# Patient Record
Sex: Male | Born: 1945 | Race: Black or African American | Hispanic: No | Smoking: Former smoker
Health system: Southern US, Community
[De-identification: ages and names within clinical notes are randomized; demographics above are authoritative.]

## PROBLEM LIST (undated history)

## (undated) DIAGNOSIS — E7800 Pure hypercholesterolemia, unspecified: Secondary | ICD-10-CM

## (undated) DIAGNOSIS — E079 Disorder of thyroid, unspecified: Secondary | ICD-10-CM

## (undated) DIAGNOSIS — G43909 Migraine, unspecified, not intractable, without status migrainosus: Secondary | ICD-10-CM

## (undated) DIAGNOSIS — I10 Essential (primary) hypertension: Secondary | ICD-10-CM

## (undated) DIAGNOSIS — G4730 Sleep apnea, unspecified: Secondary | ICD-10-CM

## (undated) HISTORY — PX: BACK SURGERY: SHX140

## (undated) HISTORY — PX: THYROID SURGERY: SHX805

---

## 2011-09-08 ENCOUNTER — Encounter (HOSPITAL_BASED_OUTPATIENT_CLINIC_OR_DEPARTMENT_OTHER): Payer: Self-pay | Admitting: *Deleted

## 2011-09-08 ENCOUNTER — Inpatient Hospital Stay (HOSPITAL_BASED_OUTPATIENT_CLINIC_OR_DEPARTMENT_OTHER)
Admission: EM | Admit: 2011-09-08 | Discharge: 2011-09-13 | DRG: 190 | Disposition: A | Discharge status: Home / Self Care | Attending: Internal Medicine | Admitting: Internal Medicine

## 2011-09-08 ENCOUNTER — Emergency Department (HOSPITAL_BASED_OUTPATIENT_CLINIC_OR_DEPARTMENT_OTHER)

## 2011-09-08 DIAGNOSIS — K219 Gastro-esophageal reflux disease without esophagitis: Secondary | ICD-10-CM | POA: Diagnosis present

## 2011-09-08 DIAGNOSIS — E669 Obesity, unspecified: Secondary | ICD-10-CM | POA: Diagnosis present

## 2011-09-08 DIAGNOSIS — Z9989 Dependence on other enabling machines and devices: Secondary | ICD-10-CM

## 2011-09-08 DIAGNOSIS — E872 Acidosis, unspecified: Secondary | ICD-10-CM | POA: Diagnosis present

## 2011-09-08 DIAGNOSIS — R739 Hyperglycemia, unspecified: Secondary | ICD-10-CM

## 2011-09-08 DIAGNOSIS — J441 Chronic obstructive pulmonary disease with (acute) exacerbation: Principal | ICD-10-CM

## 2011-09-08 DIAGNOSIS — E785 Hyperlipidemia, unspecified: Secondary | ICD-10-CM | POA: Diagnosis present

## 2011-09-08 DIAGNOSIS — J189 Pneumonia, unspecified organism: Secondary | ICD-10-CM | POA: Diagnosis present

## 2011-09-08 DIAGNOSIS — Z6832 Body mass index (BMI) 32.0-32.9, adult: Secondary | ICD-10-CM

## 2011-09-08 DIAGNOSIS — Z9119 Patient's noncompliance with other medical treatment and regimen: Secondary | ICD-10-CM

## 2011-09-08 DIAGNOSIS — J9602 Acute respiratory failure with hypercapnia: Secondary | ICD-10-CM

## 2011-09-08 DIAGNOSIS — G4733 Obstructive sleep apnea (adult) (pediatric): Secondary | ICD-10-CM | POA: Diagnosis present

## 2011-09-08 DIAGNOSIS — Z23 Encounter for immunization: Secondary | ICD-10-CM

## 2011-09-08 DIAGNOSIS — J9620 Acute and chronic respiratory failure, unspecified whether with hypoxia or hypercapnia: Secondary | ICD-10-CM | POA: Diagnosis present

## 2011-09-08 DIAGNOSIS — E119 Type 2 diabetes mellitus without complications: Secondary | ICD-10-CM | POA: Diagnosis present

## 2011-09-08 DIAGNOSIS — K5900 Constipation, unspecified: Secondary | ICD-10-CM

## 2011-09-08 DIAGNOSIS — E039 Hypothyroidism, unspecified: Secondary | ICD-10-CM | POA: Diagnosis present

## 2011-09-08 DIAGNOSIS — Z87891 Personal history of nicotine dependence: Secondary | ICD-10-CM

## 2011-09-08 DIAGNOSIS — I10 Essential (primary) hypertension: Secondary | ICD-10-CM | POA: Diagnosis present

## 2011-09-08 DIAGNOSIS — E662 Morbid (severe) obesity with alveolar hypoventilation: Secondary | ICD-10-CM | POA: Diagnosis present

## 2011-09-08 DIAGNOSIS — Z91199 Patient's noncompliance with other medical treatment and regimen due to unspecified reason: Secondary | ICD-10-CM

## 2011-09-08 DIAGNOSIS — Z79899 Other long term (current) drug therapy: Secondary | ICD-10-CM

## 2011-09-08 HISTORY — DX: Disorder of thyroid, unspecified: E07.9

## 2011-09-08 HISTORY — DX: Pure hypercholesterolemia, unspecified: E78.00

## 2011-09-08 HISTORY — DX: Essential (primary) hypertension: I10

## 2011-09-08 HISTORY — DX: Migraine, unspecified, not intractable, without status migrainosus: G43.909

## 2011-09-08 HISTORY — DX: Sleep apnea, unspecified: G47.30

## 2011-09-08 LAB — COMPREHENSIVE METABOLIC PANEL
ALT: 23 U/L (ref 0–53)
AST: 21 U/L (ref 0–37)
Albumin: 4.1 g/dL (ref 3.5–5.2)
Alkaline Phosphatase: 49 U/L (ref 39–117)
CO2: 39 mEq/L — ABNORMAL HIGH (ref 19–32)
Chloride: 101 mEq/L (ref 96–112)
Creatinine, Ser: 1.3 mg/dL (ref 0.50–1.35)
GFR calc non Af Amer: 57 mL/min — ABNORMAL LOW (ref >90)
Potassium: 4.5 mEq/L (ref 3.5–5.1)
Sodium: 143 mEq/L (ref 135–145)
Total Bilirubin: 0.3 mg/dL (ref 0.3–1.2)

## 2011-09-08 LAB — POCT I-STAT 3, ART BLOOD GAS (G3+)
Acid-Base Excess: 9 mmol/L — ABNORMAL HIGH (ref 0.0–2.0)
Bicarbonate: 39.3 mEq/L — ABNORMAL HIGH (ref 20.0–24.0)
O2 Saturation: 96 %
Patient temperature: 98.6
TCO2: 42 mmol/L (ref 0–100)
pO2, Arterial: 100 mmHg (ref 80.0–100.0)

## 2011-09-08 LAB — CBC WITH DIFFERENTIAL/PLATELET
Basophils Absolute: 0 10*3/uL (ref 0.0–0.1)
HCT: 42.2 % (ref 39.0–52.0)
Lymphocytes Relative: 34 % (ref 12–46)
Monocytes Absolute: 0.6 10*3/uL (ref 0.1–1.0)
Neutro Abs: 2.9 10*3/uL (ref 1.7–7.7)
Neutrophils Relative %: 53 % (ref 43–77)
RDW: 14.8 % (ref 11.5–15.5)
WBC: 5.5 10*3/uL (ref 4.0–10.5)

## 2011-09-08 MED ORDER — ALBUTEROL SULFATE (5 MG/ML) 0.5% IN NEBU
5.0000 mg | INHALATION_SOLUTION | Freq: Once | RESPIRATORY_TRACT | Status: AC
  Administered 2011-09-08: 5 mg via RESPIRATORY_TRACT
  Filled 2011-09-08: qty 1

## 2011-09-08 MED ORDER — IPRATROPIUM BROMIDE 0.02 % IN SOLN
0.5000 mg | Freq: Once | RESPIRATORY_TRACT | Status: AC
  Administered 2011-09-08: 0.5 mg via RESPIRATORY_TRACT
  Filled 2011-09-08: qty 2.5

## 2011-09-08 MED ORDER — METHYLPREDNISOLONE SODIUM SUCC 125 MG IJ SOLR
125.0000 mg | Freq: Once | INTRAMUSCULAR | Status: AC
  Administered 2011-09-08: 125 mg via INTRAVENOUS
  Filled 2011-09-08: qty 2

## 2011-09-08 NOTE — ED Provider Notes (Addendum)
History     CSN: [id number]  Arrival date & time 09/08/11  1958   First MD Initiated Contact with Patient 09/08/11 2019      Chief Complaint  Patient presents with  . Shortness of Breath    (Consider location/radiation/quality/duration/timing/severity/associated sxs/prior treatment) Patient is a 66 y.o. male presenting with shortness of breath. The history is provided by the patient.  Shortness of Breath  Episode onset: 3 weeks. The onset was sudden. The problem occurs continuously. The problem has been gradually worsening. The problem is moderate. Nothing relieves the symptoms. Exacerbated by: bending over. Associated symptoms include shortness of breath. There was no intake of a foreign body. He was not exposed to toxic fumes. He has not inhaled smoke recently. He has had no prior steroid use.  Pt reports he normally uses a c pap machine and did not bring with him to [facility].  Pt is visiting here from [place].  Pt is planning to move here in 3 months.  Pt reports his last visit with his Md he was told lungs were okay.  Past Medical History  Diagnosis Date  . Sleep apnea   . Hypertension   . Thyroid disease   . Hypercholesteremia   . Migraines     Past Surgical History  Procedure Date  . Back surgery   . Thyroid surgery     History reviewed. No pertinent family history.  History  Substance Use Topics  . Smoking status: Never Smoker   . Smokeless tobacco: Not on file  . Alcohol Use: Yes      Review of Systems  Respiratory: Positive for shortness of breath.   All other systems reviewed and are negative.    Allergies  Review of patient's allergies indicates not on file.  Home Medications  No current outpatient prescriptions on file.  BP 154/97  Pulse 60  Temp 97.9 F (36.6 C) (Oral)  Resp 28  Ht 6' (1.829 m)  Wt 232 lb (105.235 kg)  BMI 31.47 kg/m2  SpO2 97%  Physical Exam  Nursing note and vitals reviewed. Constitutional: He is oriented to person,  place, and time. He appears well-developed and well-nourished.  HENT:  Head: Normocephalic and atraumatic.  Right Ear: External ear normal.  Left Ear: External ear normal.  Nose: Nose normal.  Eyes: Conjunctivae and EOM are normal. Pupils are equal, round, and reactive to light.  Neck: Normal range of motion. Neck supple.  Cardiovascular: Normal rate, regular rhythm and normal heart sounds.   Pulmonary/Chest: He has no wheezes. He exhibits no tenderness.  Abdominal: Soft.  Musculoskeletal: Normal range of motion.  Neurological: He is alert and oriented to person, place, and time.  Skin: Skin is warm.  Psychiatric: He has a normal mood and affect.    ED Course  Procedures (including critical care time)  Labs Reviewed - No data to display No results found.   1. COPD with acute exacerbation     Pt given solumedrol, atrovent and albuterol,    MDM   Date: 09/08/2011  Rate: 61  Rhythm: normal sinus rhythm  QRS Axis: normal  Intervals: normal  ST/T Wave abnormalities: normal  Conduction Disutrbances:none  Narrative Interpretation:   Old EKG Reviewed: none available    Results for orders placed during the hospital encounter of 09/08/11  CBC WITH DIFFERENTIAL      Component Value Range   WBC 5.5  4.0 - 10.5 K/uL   RBC 4.65  4.22 - 5.81 MIL/uL   Hemoglobin  13.0  13.0 - 17.0 g/dL   HCT 16.1  09.6 - 04.5 %   MCV 90.8  78.0 - 100.0 fL   MCH 28.0  26.0 - 34.0 pg   MCHC 30.8  30.0 - 36.0 g/dL   RDW 40.9  81.1 - 91.4 %   Platelets 178  150 - 400 K/uL   Neutrophils Relative 53  43 - 77 %   Neutro Abs 2.9  1.7 - 7.7 K/uL   Lymphocytes Relative 34  12 - 46 %   Lymphs Abs 1.9  0.7 - 4.0 K/uL   Monocytes Relative 11  3 - 12 %   Monocytes Absolute 0.6  0.1 - 1.0 K/uL   Eosinophils Relative 2  0 - 5 %   Eosinophils Absolute 0.1  0.0 - 0.7 K/uL   Basophils Relative 0  0 - 1 %   Basophils Absolute 0.0  0.0 - 0.1 K/uL  COMPREHENSIVE METABOLIC PANEL      Component Value Range    Sodium 143  135 - 145 mEq/L   Potassium 4.5  3.5 - 5.1 mEq/L   Chloride 101  96 - 112 mEq/L   CO2 39 (*) 19 - 32 mEq/L   Glucose, Bld 121 (*) 70 - 99 mg/dL   BUN 19  6 - 23 mg/dL   Creatinine, Ser 7.82  0.50 - 1.35 mg/dL   Calcium 9.1  8.4 - 95.6 mg/dL   Total Protein 7.2  6.0 - 8.3 g/dL   Albumin 4.1  3.5 - 5.2 g/dL   AST 21  0 - 37 U/L   ALT 23  0 - 53 U/L   Alkaline Phosphatase 49  39 - 117 U/L   Total Bilirubin 0.3  0.3 - 1.2 mg/dL   GFR calc non Af Amer 57 (*) >90 mL/min   GFR calc Af Amer 66 (*) >90 mL/min  TROPONIN I      Component Value Range   Troponin I <0.30  <0.30 ng/mL  D-DIMER, QUANTITATIVE      Component Value Range   D-Dimer, Quant <0.22  0.00 - 0.48 ug/mL-FEU  POCT I-STAT 3, BLOOD GAS (G3+)      Component Value Range   pH, Arterial 7.270 (*) 7.350 - 7.450   pCO2 arterial 85.5 (*) 35.0 - 45.0 mmHg   pO2, Arterial 100.0  80.0 - 100.0 mmHg   Bicarbonate 39.3 (*) 20.0 - 24.0 mEq/L   TCO2 42  0 - 100 mmol/L   O2 Saturation 96.0     Acid-Base Excess 9.0 (*) 0.0 - 2.0 mmol/L   Patient temperature 98.6 F     Collection site RADIAL, ALLEN'S TEST ACCEPTABLE     Drawn by RT     Sample type ARTERIAL     Comment NOTIFIED PHYSICIAN     Dg Chest 2 View  09/08/2011  *RADIOLOGY REPORT*  Clinical Data: Shortness of breath with difficulty breathing.  CHEST - 2 VIEW  Comparison: None.  Findings: Patient is status post Harrington rod thoracolumbar rod fixation.  There is a moderate convex right scoliosis.  There are old right-sided rib fractures with chest wall deformity.  The heart size and mediastinal contours appear normal allowing for the scoliosis.  The lungs are clear.  There is no pleural effusion or pneumothorax.  IMPRESSION: Scoliosis and old right-sided rib fractures.  No acute cardiopulmonary process.   Original Report Authenticated By: Gerrianne Scale, M.D.    I spoke with Dr. Haroldine Laws with [facility]  who will admit at [facility] [place], [place] 09/08/11  2242  Elson Areas, PA 09/08/11 2245  Lonia Skinner [place], [place] 09/08/11 [phone]

## 2011-09-08 NOTE — ED Notes (Signed)
Pt states he has a hx of sleep apnea and has been increasingly SHOB for 3 weeks. O2 sat 86% initially at triage. 94% after taking deep breaths, but returned to the 80's. Taken to [employer]. EKG done and respiratory to bedside. BBS-diminished. Also states he has felt faint when bending over. Stomach distended.

## 2011-09-08 NOTE — ED Provider Notes (Signed)
History/physical exam/procedure(s) were performed by non-physician practitioner and as supervising physician I was immediately available for consultation/collaboration. I have reviewed all notes and am in agreement with care and plan.   Levaughn Puccinelli S Aerilyn Slee, MD 09/08/11 2304 

## 2011-09-09 ENCOUNTER — Inpatient Hospital Stay (HOSPITAL_COMMUNITY)

## 2011-09-09 ENCOUNTER — Encounter (HOSPITAL_COMMUNITY): Payer: Self-pay | Admitting: *Deleted

## 2011-09-09 DIAGNOSIS — J441 Chronic obstructive pulmonary disease with (acute) exacerbation: Principal | ICD-10-CM

## 2011-09-09 DIAGNOSIS — E785 Hyperlipidemia, unspecified: Secondary | ICD-10-CM

## 2011-09-09 DIAGNOSIS — J9600 Acute respiratory failure, unspecified whether with hypoxia or hypercapnia: Secondary | ICD-10-CM

## 2011-09-09 DIAGNOSIS — K5900 Constipation, unspecified: Secondary | ICD-10-CM

## 2011-09-09 DIAGNOSIS — Z9989 Dependence on other enabling machines and devices: Secondary | ICD-10-CM | POA: Diagnosis present

## 2011-09-09 DIAGNOSIS — I10 Essential (primary) hypertension: Secondary | ICD-10-CM | POA: Diagnosis present

## 2011-09-09 DIAGNOSIS — R739 Hyperglycemia, unspecified: Secondary | ICD-10-CM

## 2011-09-09 DIAGNOSIS — E039 Hypothyroidism, unspecified: Secondary | ICD-10-CM

## 2011-09-09 DIAGNOSIS — J9602 Acute respiratory failure with hypercapnia: Secondary | ICD-10-CM

## 2011-09-09 LAB — BLOOD GAS, ARTERIAL
Acid-Base Excess: 8.5 mmol/L — ABNORMAL HIGH (ref 0.0–2.0)
Acid-Base Excess: 8.8 mmol/L — ABNORMAL HIGH (ref 0.0–2.0)
Bicarbonate: 35.7 mEq/L — ABNORMAL HIGH (ref 20.0–24.0)
Bicarbonate: 34.8 mEq/L — ABNORMAL HIGH (ref 20.0–24.0)
Delivery systems: POSITIVE
Drawn by: 249101
Drawn by: 20517
Mode: POSITIVE
O2 Content: 3 L/min
O2 Saturation: 92.6 %
Patient temperature: 98.6
TCO2: 38 mmol/L (ref 0–100)
pCO2 arterial: 79.2 mmHg (ref 35.0–45.0)
pCO2 arterial: 66.3 mmHg (ref 35.0–45.0)
pH, Arterial: 7.274 — ABNORMAL LOW (ref 7.350–7.450)
pO2, Arterial: 77.4 mmHg — ABNORMAL LOW (ref 80.0–100.0)
pO2, Arterial: 64.2 mmHg — ABNORMAL LOW (ref 80.0–100.0)

## 2011-09-09 LAB — COMPREHENSIVE METABOLIC PANEL
ALT: 23 U/L (ref 0–53)
AST: 18 U/L (ref 0–37)
Alkaline Phosphatase: 51 U/L (ref 39–117)
CO2: 34 mEq/L — ABNORMAL HIGH (ref 19–32)
GFR calc Af Amer: 78 mL/min — ABNORMAL LOW (ref >90)
GFR calc non Af Amer: 67 mL/min — ABNORMAL LOW (ref >90)
Glucose, Bld: 300 mg/dL — ABNORMAL HIGH (ref 70–99)
Potassium: 4.8 mEq/L (ref 3.5–5.1)
Sodium: 141 mEq/L (ref 135–145)

## 2011-09-09 LAB — CBC WITH DIFFERENTIAL/PLATELET
Basophils Absolute: 0 10*3/uL (ref 0.0–0.1)
Basophils Relative: 0 % (ref 0–1)
Eosinophils Absolute: 0 10*3/uL (ref 0.0–0.7)
HCT: 43.5 % (ref 39.0–52.0)
MCH: 28.2 pg (ref 26.0–34.0)
MCHC: 30.3 g/dL (ref 30.0–36.0)
Monocytes Absolute: 0 10*3/uL — ABNORMAL LOW (ref 0.1–1.0)
Neutro Abs: 5.8 10*3/uL (ref 1.7–7.7)
Neutrophils Relative %: 90 % — ABNORMAL HIGH (ref 43–77)
RDW: 14.7 % (ref 11.5–15.5)

## 2011-09-09 LAB — STREP PNEUMONIAE URINARY ANTIGEN: Strep Pneumo Urinary Antigen: NEGATIVE

## 2011-09-09 LAB — GLUCOSE, CAPILLARY
Glucose-Capillary: 207 mg/dL — ABNORMAL HIGH (ref 70–99)
Glucose-Capillary: 229 mg/dL — ABNORMAL HIGH (ref 70–99)

## 2011-09-09 LAB — HEMOGLOBIN A1C: Hgb A1c MFr Bld: 7.4 % — ABNORMAL HIGH (ref <5.7)

## 2011-09-09 LAB — MRSA PCR SCREENING: MRSA by PCR: NEGATIVE

## 2011-09-09 LAB — PRO B NATRIURETIC PEPTIDE: Pro B Natriuretic peptide (BNP): 63.8 pg/mL (ref 0–125)

## 2011-09-09 MED ORDER — SODIUM CHLORIDE 0.9 % IJ SOLN
3.0000 mL | Freq: Two times a day (BID) | INTRAMUSCULAR | Status: DC
  Administered 2011-09-09 – 2011-09-11 (×5): 3 mL via INTRAVENOUS

## 2011-09-09 MED ORDER — MOXIFLOXACIN HCL IN NACL 400 MG/250ML IV SOLN
400.0000 mg | INTRAVENOUS | Status: DC
  Administered 2011-09-09 – 2011-09-11 (×3): 400 mg via INTRAVENOUS
  Filled 2011-09-09 (×3): qty 250

## 2011-09-09 MED ORDER — METHYLPREDNISOLONE SODIUM SUCC 125 MG IJ SOLR
80.0000 mg | Freq: Two times a day (BID) | INTRAMUSCULAR | Status: DC
  Administered 2011-09-10: 80 mg via INTRAVENOUS
  Filled 2011-09-09: qty 1.28
  Filled 2011-09-09: qty 2
  Filled 2011-09-09: qty 1.28

## 2011-09-09 MED ORDER — ALBUTEROL SULFATE (5 MG/ML) 0.5% IN NEBU
2.5000 mg | INHALATION_SOLUTION | Freq: Four times a day (QID) | RESPIRATORY_TRACT | Status: DC
  Administered 2011-09-09 – 2011-09-13 (×17): 2.5 mg via RESPIRATORY_TRACT
  Filled 2011-09-09 (×17): qty 0.5

## 2011-09-09 MED ORDER — LEVOTHYROXINE SODIUM 125 MCG PO TABS
125.0000 ug | ORAL_TABLET | Freq: Every day | ORAL | Status: DC
  Administered 2011-09-10 – 2011-09-13 (×4): 125 ug via ORAL
  Filled 2011-09-09 (×5): qty 1

## 2011-09-09 MED ORDER — PROPRANOLOL HCL ER BEADS 120 MG PO CP24
120.0000 mg | ORAL_CAPSULE | Freq: Every day | ORAL | Status: DC
  Filled 2011-09-09: qty 1

## 2011-09-09 MED ORDER — HYDROMORPHONE HCL PF 1 MG/ML IJ SOLN
0.5000 mg | Freq: Once | INTRAMUSCULAR | Status: AC
  Administered 2011-09-09: 0.5 mg via INTRAVENOUS
  Filled 2011-09-09: qty 1

## 2011-09-09 MED ORDER — ACETAMINOPHEN 325 MG PO TABS
650.0000 mg | ORAL_TABLET | Freq: Four times a day (QID) | ORAL | Status: DC | PRN
  Administered 2011-09-10: 650 mg via ORAL
  Filled 2011-09-09: qty 2

## 2011-09-09 MED ORDER — LISINOPRIL 2.5 MG PO TABS
2.5000 mg | ORAL_TABLET | Freq: Every day | ORAL | Status: DC
  Filled 2011-09-09: qty 1

## 2011-09-09 MED ORDER — ASPIRIN 325 MG PO TABS
325.0000 mg | ORAL_TABLET | Freq: Every day | ORAL | Status: DC
  Administered 2011-09-10 – 2011-09-13 (×4): 325 mg via ORAL
  Filled 2011-09-09 (×5): qty 1

## 2011-09-09 MED ORDER — INSULIN GLARGINE 100 UNIT/ML ~~LOC~~ SOLN
5.0000 [IU] | Freq: Every day | SUBCUTANEOUS | Status: DC
  Administered 2011-09-09: 5 [IU] via SUBCUTANEOUS

## 2011-09-09 MED ORDER — PANTOPRAZOLE SODIUM 40 MG PO TBEC
40.0000 mg | DELAYED_RELEASE_TABLET | Freq: Every day | ORAL | Status: DC
  Administered 2011-09-10 – 2011-09-13 (×4): 40 mg via ORAL
  Filled 2011-09-09 (×4): qty 1

## 2011-09-09 MED ORDER — HYDRALAZINE HCL 20 MG/ML IJ SOLN
10.0000 mg | INTRAMUSCULAR | Status: DC | PRN
  Administered 2011-09-11: 10 mg via INTRAVENOUS
  Filled 2011-09-09 (×2): qty 0.5

## 2011-09-09 MED ORDER — ENOXAPARIN SODIUM 40 MG/0.4ML ~~LOC~~ SOLN
40.0000 mg | SUBCUTANEOUS | Status: DC
  Administered 2011-09-09 – 2011-09-11 (×3): 40 mg via SUBCUTANEOUS
  Filled 2011-09-09 (×3): qty 0.4

## 2011-09-09 MED ORDER — INSULIN ASPART 100 UNIT/ML ~~LOC~~ SOLN
0.0000 [IU] | Freq: Every day | SUBCUTANEOUS | Status: DC
  Administered 2011-09-09 – 2011-09-10 (×2): 2 [IU] via SUBCUTANEOUS
  Administered 2011-09-11 – 2011-09-12 (×2): 3 [IU] via SUBCUTANEOUS

## 2011-09-09 MED ORDER — INSULIN GLARGINE 100 UNIT/ML ~~LOC~~ SOLN: 5.0000 [IU] | Freq: Every day | SUBCUTANEOUS | Status: DC

## 2011-09-09 MED ORDER — METHYLPREDNISOLONE SODIUM SUCC 40 MG IJ SOLR
40.0000 mg | Freq: Two times a day (BID) | INTRAMUSCULAR | Status: DC
  Administered 2011-09-09: 40 mg via INTRAVENOUS
  Filled 2011-09-09 (×3): qty 1

## 2011-09-09 MED ORDER — INSULIN ASPART 100 UNIT/ML ~~LOC~~ SOLN
0.0000 [IU] | Freq: Three times a day (TID) | SUBCUTANEOUS | Status: DC
  Administered 2011-09-09: 5 [IU] via SUBCUTANEOUS
  Administered 2011-09-09 – 2011-09-10 (×2): 3 [IU] via SUBCUTANEOUS
  Administered 2011-09-10: 5 [IU] via SUBCUTANEOUS
  Administered 2011-09-10: 11 [IU] via SUBCUTANEOUS
  Administered 2011-09-11: 8 [IU] via SUBCUTANEOUS
  Administered 2011-09-11: 3 [IU] via SUBCUTANEOUS
  Administered 2011-09-11: 2 [IU] via SUBCUTANEOUS
  Administered 2011-09-12: 3 [IU] via SUBCUTANEOUS
  Administered 2011-09-12 (×2): 5 [IU] via SUBCUTANEOUS
  Administered 2011-09-13: 2 [IU] via SUBCUTANEOUS

## 2011-09-09 MED ORDER — L-METHYLFOLATE-B6-B12 3-35-2 MG PO TABS
1.0000 | ORAL_TABLET | Freq: Every day | ORAL | Status: DC
  Administered 2011-09-10 – 2011-09-13 (×4): 1 via ORAL
  Filled 2011-09-09 (×5): qty 1

## 2011-09-09 MED ORDER — FLEET ENEMA 7-19 GM/118ML RE ENEM
1.0000 | ENEMA | Freq: Every day | RECTAL | Status: DC | PRN
  Filled 2011-09-09: qty 1

## 2011-09-09 MED ORDER — BISOPROLOL FUMARATE 10 MG PO TABS
10.0000 mg | ORAL_TABLET | Freq: Every day | ORAL | Status: DC
  Filled 2011-09-09: qty 1

## 2011-09-09 MED ORDER — ATORVASTATIN CALCIUM 80 MG PO TABS
80.0000 mg | ORAL_TABLET | Freq: Every day | ORAL | Status: DC
  Administered 2011-09-09 – 2011-09-12 (×4): 80 mg via ORAL
  Filled 2011-09-09 (×8): qty 1

## 2011-09-09 MED ORDER — ACETAMINOPHEN 650 MG RE SUPP: 650.0000 mg | Freq: Four times a day (QID) | RECTAL | Status: DC | PRN

## 2011-09-09 MED ORDER — ONDANSETRON HCL 4 MG/2ML IJ SOLN: 4.0000 mg | Freq: Four times a day (QID) | INTRAMUSCULAR | Status: DC | PRN

## 2011-09-09 MED ORDER — ONDANSETRON HCL 4 MG PO TABS: 4.0000 mg | ORAL_TABLET | Freq: Four times a day (QID) | ORAL | Status: DC | PRN

## 2011-09-09 MED ORDER — ALBUTEROL SULFATE (5 MG/ML) 0.5% IN NEBU: 2.5000 mg | INHALATION_SOLUTION | RESPIRATORY_TRACT | Status: DC | PRN

## 2011-09-09 MED ORDER — BUDESONIDE 0.5 MG/2ML IN SUSP
0.5000 mg | Freq: Two times a day (BID) | RESPIRATORY_TRACT | Status: DC
  Administered 2011-09-10 – 2011-09-13 (×6): 0.5 mg via RESPIRATORY_TRACT
  Filled 2011-09-09 (×12): qty 2

## 2011-09-09 MED ORDER — PNEUMOCOCCAL VAC POLYVALENT 25 MCG/0.5ML IJ INJ
0.5000 mL | INJECTION | INTRAMUSCULAR | Status: AC
  Filled 2011-09-09 (×2): qty 0.5

## 2011-09-09 MED ORDER — IPRATROPIUM BROMIDE 0.02 % IN SOLN
0.5000 mg | Freq: Four times a day (QID) | RESPIRATORY_TRACT | Status: DC
  Administered 2011-09-09 – 2011-09-13 (×17): 0.5 mg via RESPIRATORY_TRACT
  Filled 2011-09-09 (×17): qty 2.5

## 2011-09-09 NOTE — Progress Notes (Signed)
CRITICAL VALUE ALERT  Critical value received:ABG Date of notification: 09/09/2011 Time of notification: 1635  Critical value read back:yes  Nurse who received alert:  Broadus John  MD notified (1st page): Short Time of first page: 1640 MD notified (2nd page):  Time of second page:  Responding MD: Short  Time MD responded: [phone]

## 2011-09-09 NOTE — Progress Notes (Signed)
  Lab 09/09/11 1615 09/09/11 0835 09/09/11 0433 09/08/11 2055  PHART 7.339* 7.274* 7.245* 7.270*  PCO2ART 66.3* 79.2* 85.3* 85.5*  PO2ART 64.2* 77.4* 72.5* 100.0  HCO3 34.8* 35.5* 35.7* 39.3*  TCO2 36.8 38.0 38.3 42  O2SAT 91.4 93.5 92.6 96.0    Restart bipap

## 2011-09-09 NOTE — ED Provider Notes (Signed)
66 y.o male complaining of increasing dyspnea for several weeks.  Patient with history of copd , no fever , no leg pain or history of dvt or pe.  Patient with acidosis and hypercarbia on abg.  Plan admission for furthere evaluation and treatment.  Lungs - decreased bs throughout.no distress noted.   Hilario Quarry, MD 09/09/11 2009

## 2011-09-09 NOTE — H&P (Addendum)
Richard Padilla is an 66 y.o. male.   Patient was seen and examined on September 09, 2011 at 2:35 AM. PCP - Dr.Sandu. Chief Complaint: Shortness of breath. HPI: 66 year-old male with known history of sleep apnea on CPAP, hypertension, hyperlipidemia and previous history of cigarette smoking is visiting [place] and has been here for last 3 months. During this period patient has not used his CPAP as he left it back in his home. Patient has been gradually getting short of breath over the last 3 weeks. Has nonproductive cough. Denies any chest pain or fever. Patient presented to the ER at [facility]. Over there patient was found to be wheezing and was placed on nebulizer and steroids. After which patient's condition significantly improved and admitted for further management. Patient at this time is not in acute distress and is able to talk without any difficulty.  Past Medical History  Diagnosis Date  . Sleep apnea   . Hypertension   . Thyroid disease   . Hypercholesteremia   . Migraines     Past Surgical History  Procedure Date  . Back surgery   . Thyroid surgery     History reviewed. No pertinent family history. Social History:  reports that he quit smoking about 20 years ago. His smoking use included Cigarettes. He has a 40 pack-year smoking history. He does not have any smokeless tobacco history on file. He reports that he drinks alcohol. He reports that he does not use illicit drugs.  Allergies: No Known Allergies  Medications Prior to Admission  Medication Sig Dispense Refill  . acetaminophen (TYLENOL) 500 MG tablet Take 1,000 mg by mouth every 6 (six) hours as needed. For migraine      . aspirin 325 MG tablet Take 325 mg by mouth daily.      Marland Kitchen ibuprofen (ADVIL,MOTRIN) 200 MG tablet Take 400 mg by mouth every 6 (six) hours as needed. For migraine.      Marland Kitchen l-methylfolate-B6-B12 (METANX) 3-35-2 MG TABS Take 1 tablet by mouth daily.      Marland Kitchen levothyroxine (SYNTHROID,  LEVOTHROID) 125 MCG tablet Take 125 mcg by mouth daily.      Marland Kitchen lisinopril (PRINIVIL,ZESTRIL) 2.5 MG tablet Take 2.5 mg by mouth daily.      . meloxicam (MOBIC) 15 MG tablet Take 7.5 mg by mouth daily.      . Multiple Vitamins-Minerals (ALIVE MENS ENERGY) TABS Take 1 tablet by mouth daily.      Marland Kitchen omeprazole (PRILOSEC) 20 MG capsule Take 20 mg by mouth daily.      . propranolol (INNOPRAN XL) 120 MG 24 hr capsule Take 120 mg by mouth at bedtime.      . rosuvastatin (CRESTOR) 40 MG tablet Take 40 mg by mouth daily.        Results for orders placed during the hospital encounter of 09/08/11 (from the past 48 hour(s))  CBC WITH DIFFERENTIAL     Status: Normal   Collection Time   09/08/11  8:55 PM      Component Value Range Comment   WBC 5.5  4.0 - 10.5 K/uL    RBC 4.65  4.22 - 5.81 MIL/uL    Hemoglobin 13.0  13.0 - 17.0 g/dL    HCT 62.1  30.8 - 65.7 %    MCV 90.8  78.0 - 100.0 fL    MCH 28.0  26.0 - 34.0 pg    MCHC 30.8  30.0 - 36.0 g/dL    RDW 84.6  11.5 - 15.5 %    Platelets 178  150 - 400 K/uL    Neutrophils Relative 53  43 - 77 %    Neutro Abs 2.9  1.7 - 7.7 K/uL    Lymphocytes Relative 34  12 - 46 %    Lymphs Abs 1.9  0.7 - 4.0 K/uL    Monocytes Relative 11  3 - 12 %    Monocytes Absolute 0.6  0.1 - 1.0 K/uL    Eosinophils Relative 2  0 - 5 %    Eosinophils Absolute 0.1  0.0 - 0.7 K/uL    Basophils Relative 0  0 - 1 %    Basophils Absolute 0.0  0.0 - 0.1 K/uL   COMPREHENSIVE METABOLIC PANEL     Status: Abnormal   Collection Time   09/08/11  8:55 PM      Component Value Range Comment   Sodium 143  135 - 145 mEq/L    Potassium 4.5  3.5 - 5.1 mEq/L    Chloride 101  96 - 112 mEq/L    CO2 39 (*) 19 - 32 mEq/L    Glucose, Bld 121 (*) 70 - 99 mg/dL    BUN 19  6 - 23 mg/dL    Creatinine, Ser 1.60  0.50 - 1.35 mg/dL    Calcium 9.1  8.4 - 10.9 mg/dL    Total Protein 7.2  6.0 - 8.3 g/dL    Albumin 4.1  3.5 - 5.2 g/dL    AST 21  0 - 37 U/L    ALT 23  0 - 53 U/L    Alkaline Phosphatase  49  39 - 117 U/L    Total Bilirubin 0.3  0.3 - 1.2 mg/dL    GFR calc non Af Amer 57 (*) >90 mL/min    GFR calc Af Amer 66 (*) >90 mL/min   TROPONIN I     Status: Normal   Collection Time   09/08/11  8:55 PM      Component Value Range Comment   Troponin I <0.30  <0.30 ng/mL   D-DIMER, QUANTITATIVE     Status: Normal   Collection Time   09/08/11  8:55 PM      Component Value Range Comment   D-Dimer, Quant <0.22  0.00 - 0.48 ug/mL-FEU   POCT I-STAT 3, BLOOD GAS (G3+)     Status: Abnormal   Collection Time   09/08/11  8:55 PM      Component Value Range Comment   pH, Arterial 7.270 (*) 7.350 - 7.450    pCO2 arterial 85.5 (*) 35.0 - 45.0 mmHg    pO2, Arterial 100.0  80.0 - 100.0 mmHg    Bicarbonate 39.3 (*) 20.0 - 24.0 mEq/L    TCO2 42  0 - 100 mmol/L    O2 Saturation 96.0      Acid-Base Excess 9.0 (*) 0.0 - 2.0 mmol/L    Patient temperature 98.6 F      Collection site RADIAL, ALLEN'S TEST ACCEPTABLE      Drawn by RT      Sample type ARTERIAL      Comment NOTIFIED PHYSICIAN      Dg Chest 2 View  09/08/2011  *RADIOLOGY REPORT*  Clinical Data: Shortness of breath with difficulty breathing.  CHEST - 2 VIEW  Comparison: None.  Findings: Patient is status post Harrington rod thoracolumbar rod fixation.  There is a moderate convex right scoliosis.  There are old right-sided rib fractures  with chest wall deformity.  The heart size and mediastinal contours appear normal allowing for the scoliosis.  The lungs are clear.  There is no pleural effusion or pneumothorax.  IMPRESSION: Scoliosis and old right-sided rib fractures.  No acute cardiopulmonary process.   Original Report Authenticated By: Gerrianne Scale, M.D.     Review of Systems  Constitutional: Negative.   HENT: Negative.   Eyes: Negative.   Respiratory: Positive for cough and shortness of breath.   Cardiovascular: Negative.   Gastrointestinal: Negative.   Genitourinary: Negative.   Musculoskeletal: Negative.   Skin: Negative.     Neurological: Negative.   Endo/Heme/Allergies: Negative.   Psychiatric/Behavioral: Negative.     Blood pressure 154/92, pulse 61, temperature 98.6 F (37 C), temperature source Oral, resp. rate 20, height 6' (1.829 m), weight 106.8 kg (235 lb 7.2 oz), SpO2 94.00%. Physical Exam  Constitutional: He is oriented to person, place, and time. He appears well-developed and well-nourished. No distress.  HENT:  Head: Normocephalic and atraumatic.  Right Ear: External ear normal.  Left Ear: External ear normal.  Nose: Nose normal.  Mouth/Throat: Oropharynx is clear and moist. No oropharyngeal exudate.  Eyes: Conjunctivae are normal. Pupils are equal, round, and reactive to light. Right eye exhibits no discharge. Left eye exhibits no discharge. No scleral icterus.  Neck: Normal range of motion. Neck supple.  Cardiovascular: Normal rate and regular rhythm.   Respiratory: Effort normal. No respiratory distress. He has wheezes. He has no rales.  GI: Soft. Bowel sounds are normal. He exhibits distension. There is no tenderness. There is no rebound.  Musculoskeletal: Normal range of motion. He exhibits no edema and no tenderness.  Neurological: He is alert and oriented to person, place, and time.       Moves all extremities.  Skin: Skin is warm and dry. He is not diaphoretic.     Assessment/Plan #1. Acute exacerbation of COPD - continue with IV steroids antibiotics Pulmicort and nebulizer. #2. OSA on CPAP - has not been using his CPAP for last 3 months. I have asked respiratory to place him back on the CPAP. #3. Hypertension - continue present medications. #4. Hypothyroidism - continue present medications. #5. Hyperlipidemia - continue present medications.  Patient's ABG shows respiratory acidosis. But clinically patient looks improved. I have ordered a repeat ABG after patient being placed on CPAP. Patient's COPD probably worsened with noncompliant with CPAP for his OSA.  CODE STATUS - full  code.    Cyriah Childrey N. 09/09/2011, 2:50 AM Addendum - patient's repeat ABG still shows respiratory acidosis. At this time I have discussed with pulmonary critical care consultant. Patient will be transferred to step down unit. Placed on BiPAP. Check stat chest x-ray, troponin, BNP, d-dimer and metabolic panel and CBC. Patient has Acute hypercarbic respiratory failure. Discussed with patient's wife who is at the bedside.  Midge Minium

## 2011-09-09 NOTE — Progress Notes (Signed)
[facility] PROGRESS NOTE  Anastacio Bua BJY:[id number] DOB: Jan 29, 1945 DOA: 09/08/2011 PCP: No primary provider on file.  Assessment/Plan: Principal Problem:  *COPD with acute exacerbation Active Problems:  HTN (hypertension)  Hypothyroidism  Hyperlipidemia  OSA on CPAP  Constipation  Hyperglycemia  Acute respiratory failure with hypercapnia  COPD with acute exacerbation and hypercapneic respiratory failure.  ABG mildly improved after one to two hours of BIPAP 16/6.  Patient mentating well without confusion.  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 -  Continue Bipap at current settings -  Duonebs q6h with alb q2h prn  -  Methylpred 80mg  iv q12h -  Moxifloxacin   Hyperglycemia, hx of diet controlled diabetes.   -  A1c -  Sliding scale insulin -  Will add lantus as needed  Hypertension -  D/C ACEI and nonselective beta blocker -  While NPO, hydralazine 10mg  IV for SBP > 160 or DBP > 100 -  Will start bisoprolol or metoprolol pending improving +/- ARB (given diabetes) -  Continue ASA (take when no longer wearing bipap mask)  Hyperlipidemia:   -  Restart statin when able  Hypothyroidism: -  Continue synthroid when able  GERD:   -  Protonix while inpatient (for GERD and GI proph while on high dose steroids)  DIET:  Healthy heart, diabetic, B6, B12 complex ACCESS:  PIV PROPH:  Lovenox  Code Status: Full Family Communication: Spoke with fiancee and patient  Disposition Plan: Will need outpatient PFTs.  Continue current therapy   Brief narrative:  66 year-old male with known history of sleep apnea on CPAP, hypertension, hyperlipidemia and previous history of cigarette smoking is visiting [place] and has been here for last 3 months. During this period patient has not used his CPAP as he left it back in his home. Patient has been gradually getting Adanna Zuckerman of breath over the last 3 weeks. Has nonproductive cough. Denies any chest pain or fever. Patient  presented to the ER at [name]. Over there patient was found to be wheezing and was placed on nebulizer and steroids. After which patient's condition significantly improved and admitted for further management. Patient at this time is not in acute distress and is able to talk without any difficulty.  Consultants:  Pulmonology  Procedures:  Bipap  Antibiotics:  Moxifloxacin 9/2 >>  HPI/Subjective:  Patient states his thinking is clear and his breathing is much easier on the bipap this morning.  Denies nausea, vomiting, chest pain, diarrhea.  Has constipation.    Objective: Filed Vitals:   09/09/11 0865 09/09/11 0752 09/09/11 0755 09/09/11 0808  BP:  131/75    Pulse: 85 61    Temp:   97.6 F (36.4 C)   TempSrc:   Oral   Resp: 22 28    Height:      Weight:      SpO2: 96% 97%  97%   No intake or output data in the 24 hours ending 09/09/11 0907 Filed Weights   09/08/11 2015 09/09/11 0120 09/09/11 0645  Weight: 105.235 kg (232 lb) 106.8 kg (235 lb 7.2 oz) 108.9 kg (240 lb 1.3 oz)    Exam:   General:  Obese AAM, no acute distress  HEENT:  Bipap mask on.  PERRL, anicteric  Neck:  Supple, no thyromegaly or nodules  Lymph:  No supraclavicular or cervical LN  Cardiovascular: RRR, no murmurs  Respiratory:  Diminished BS at teh bilateral bases.  Sounds from bipap machine.  No focal rales  or rhonchi  Abdomen: NABS, distended, soft, nontender, obese  Ext:  2+ pulses, no LEE  Psych:  Very alert and interactive.  A&Ox4  Data Reviewed: Basic Metabolic Panel:  Lab 09/09/11 4098 09/08/11 2055  NA 141 143  K 4.8 4.5  CL 98 101  CO2 34* 39*  GLUCOSE 300* 121*  BUN 18 19  CREATININE 1.13 1.30  CALCIUM 9.2 9.1  MG -- --  PHOS -- --   Liver Function Tests:  Lab 09/09/11 0630 09/08/11 2055  AST 18 21  ALT 23 23  ALKPHOS 51 49  BILITOT 0.2* 0.3  PROT 7.2 7.2  ALBUMIN 3.8 4.1   No results found for this basename: LIPASE:5,AMYLASE:5 in the last 168  hours No results found for this basename: AMMONIA:5 in the last 168 hours CBC:  Lab 09/09/11 0630 09/08/11 2055  WBC 6.4 5.5  NEUTROABS 5.8 2.9  HGB 13.2 13.0  HCT 43.5 42.2  MCV 92.9 90.8  PLT 193 178   Cardiac Enzymes:  Lab 09/09/11 0635 09/08/11 2055  CKTOTAL -- --  CKMB -- --  CKMBINDEX -- --  TROPONINI <0.30 <0.30   BNP (last 3 results) No results found for this basename: PROBNP:3 in the last 8760 hours CBG: No results found for this basename: GLUCAP:5 in the last 168 hours  Recent Results (from the past 240 hour(s))  MRSA PCR SCREENING     Status: Normal   Collection Time   09/09/11  6:59 AM      Component Value Range Status Comment   MRSA by PCR NEGATIVE  NEGATIVE Final      Studies: Dg Chest 2 View  09/08/2011  *RADIOLOGY REPORT*  Clinical Data: Shortness of breath with difficulty breathing.  CHEST - 2 VIEW  Comparison: None.  Findings: Patient is status post Harrington rod thoracolumbar rod fixation.  There is a moderate convex right scoliosis.  There are old right-sided rib fractures with chest wall deformity.  The heart size and mediastinal contours appear normal allowing for the scoliosis.  The lungs are clear.  There is no pleural effusion or pneumothorax.  IMPRESSION: Scoliosis and old right-sided rib fractures.  No acute cardiopulmonary process.   Original Report Authenticated By: Gerrianne Scale, M.D.    Dg Chest Port 1 View  09/09/2011  *RADIOLOGY REPORT*  Clinical Data: Shortness of breath, respiratory distress, history sleep apnea, hypertension  PORTABLE CHEST - 1 VIEW  Comparison: Portable exam 0605 hours compared to 09/08/2011  Findings: Dextroconvex thoracic scoliosis post spinal rod placement. Normal heart size, mediastinal contours and pulmonary vascularity for technique. Increased markings in right lower lobe since previous exam question developing infiltrate. Remaining lungs clear. No pleural effusion or pneumothorax.  IMPRESSION: Scoliosis. Question  developing right lower lobe infiltrate.   Original Report Authenticated By: Lollie Marrow, M.D.     Scheduled Meds:    . albuterol  2.5 mg Nebulization Q6H  . albuterol  5 mg Nebulization Once  . aspirin  325 mg Oral Daily  . atorvastatin  80 mg Oral q1800  . budesonide  0.5 mg Nebulization BID  . enoxaparin (LOVENOX) injection  40 mg Subcutaneous Q24H  .  HYDROmorphone (DILAUDID) injection  0.5 mg Intravenous Once  . insulin aspart  0-15 Units Subcutaneous TID WC  . insulin aspart  0-5 Units Subcutaneous QHS  . insulin glargine  5 Units Subcutaneous QHS  . ipratropium  0.5 mg Nebulization Once  . ipratropium  0.5 mg Nebulization Q6H  . l-methylfolate-B6-B12  1 tablet Oral Daily  . levothyroxine  125 mcg Oral Daily  . methylPREDNISolone (SOLU-MEDROL) injection  125 mg Intravenous Once  . methylPREDNISolone (SOLU-MEDROL) injection  80 mg Intravenous Q12H  . moxifloxacin  400 mg Intravenous Q24H  . pantoprazole  40 mg Oral Q1200  . pneumococcal 23 valent vaccine  0.5 mL Intramuscular Tomorrow-1000  . sodium chloride  3 mL Intravenous Q12H  . DISCONTD: bisoprolol  10 mg Oral Daily  . DISCONTD: lisinopril  2.5 mg Oral Daily  . DISCONTD: methylPREDNISolone (SOLU-MEDROL) injection  40 mg Intravenous Q12H  . DISCONTD: propranolol  120 mg Oral QHS   Continuous Infusions:   Principal Problem:  *COPD with acute exacerbation Active Problems:  HTN (hypertension)  Hypothyroidism  Hyperlipidemia  OSA on CPAP  Constipation  Hyperglycemia  Acute respiratory failure with hypercapnia    Time spent: 45    Kaushal Vannice, [facility]  [facility] Pager [phone]. If 8PM-8AM, please contact night-coverage at [email or web address], password [facility] 09/09/2011, 9:07 AM  LOS: 1 day

## 2011-09-09 NOTE — Progress Notes (Signed)
Pt does not wish to be placed on Bipap at this time. Bipap is on standby. Pt is willing to put mask on before bedtime. No distress noted at this time.

## 2011-09-09 NOTE — Progress Notes (Signed)
Placed patient on CPAP via auto-mode with auto-minimum of 4cm and auto-maximum at 20cm. Oxygen at 2lpm. Will continue to monitor.

## 2011-09-09 NOTE — ED Notes (Signed)
[facility] here to transport pt. To [facility]

## 2011-09-09 NOTE — Progress Notes (Signed)
Patient arrived to the floor from [facility] via [facility].  Patient able to ambulate from stretcher to bathroom with no assistance.  Gait steady.  A & O x 4.  No c/o sob.  C/o headache - rating pain as 8/10.  O2 at 82% on room air.  O2 @ 87% at 2 LPM.  O2 at 96 % at 3 LPM.  He lives in [place] with grandchild.  He is currently visiting close friend, Jeannine Kitten, in [place].  He is thinking about moving to [place].  He is supposed to wear CPAP at night but left the machine in [place].  He has had increasing SOB for several months that has worsened over the last 3 weeks.  No skin issues except dry flaky skin.  He is retired from [employer].  All personal valuables sent home with friend.  Dr. Toniann Fail made aware and given order for 0.5 mg IV Dilaudid which was administered.  He is aware that he is High Fall Risk based on assessment.  He has agreed to call for assistance when OOB and bed alarm for the first 24 hours.  He states that he almost blacks out when bending over at times and has fallen out of bed x 1 within last month.  He asks that we revaluate after 24 hours.  Will continue to monitor patient.  Stacie Glaze 09/09/2011

## 2011-09-09 NOTE — Progress Notes (Signed)
CRITICAL VALUE ALERT  Critical value received:  ABG  Date of notification:  09/09/2011  Time of notification: 0800  Critical value read back:yes  Nurse who received alert:  Broadus John  MD notified (1st page):  Short  Time of first page:  MD notified (2nd page):  Time of second page:  Responding MD:  Short  Time MD responded: MD in pt room

## 2011-09-09 NOTE — Progress Notes (Signed)
Subjective: Interval History: CO2 85.3 after approximately 1 hour on CPAP Objective: Vital signs in last 24 hours: Temp:  [97.7 F (36.5 C)-98.6 F (37 C)] 97.7 F (36.5 C) (09/02 0527) Pulse Rate:  [60-65] 65  (09/02 0527) Resp:  [18-28] 18  (09/02 0527) BP: (105-154)/(69-97) 105/69 mmHg (09/02 0527) SpO2:  [94 %-100 %] 95 % (09/02 0527) Weight:  [105.235 kg (232 lb)-106.8 kg (235 lb 7.2 oz)] 106.8 kg (235 lb 7.2 oz) (09/02 0120)  Intake/Output from previous day:   Intake/Output this shift:    BP 105/69  Pulse 65  Temp 97.7 F (36.5 C) (Oral)  Resp 18  Ht 6' (1.829 m)  Wt 106.8 kg (235 lb 7.2 oz)  BMI 31.93 kg/m2  SpO2 95% General appearance: sleepy but arousable, answers questions, oriented to person, place Lungs: wheezes bilaterally Heart: regular rate and rhythm  Results for orders placed during the hospital encounter of 09/08/11 (from the past 24 hour(s))  CBC WITH DIFFERENTIAL     Status: Normal   Collection Time   09/08/11  8:55 PM      Component Value Range   WBC 5.5  4.0 - 10.5 K/uL   RBC 4.65  4.22 - 5.81 MIL/uL   Hemoglobin 13.0  13.0 - 17.0 g/dL   HCT 82.9  56.2 - 13.0 %   MCV 90.8  78.0 - 100.0 fL   MCH 28.0  26.0 - 34.0 pg   MCHC 30.8  30.0 - 36.0 g/dL   RDW 86.5  78.4 - 69.6 %   Platelets 178  150 - 400 K/uL   Neutrophils Relative 53  43 - 77 %   Neutro Abs 2.9  1.7 - 7.7 K/uL   Lymphocytes Relative 34  12 - 46 %   Lymphs Abs 1.9  0.7 - 4.0 K/uL   Monocytes Relative 11  3 - 12 %   Monocytes Absolute 0.6  0.1 - 1.0 K/uL   Eosinophils Relative 2  0 - 5 %   Eosinophils Absolute 0.1  0.0 - 0.7 K/uL   Basophils Relative 0  0 - 1 %   Basophils Absolute 0.0  0.0 - 0.1 K/uL  COMPREHENSIVE METABOLIC PANEL     Status: Abnormal   Collection Time   09/08/11  8:55 PM      Component Value Range   Sodium 143  135 - 145 mEq/L   Potassium 4.5  3.5 - 5.1 mEq/L   Chloride 101  96 - 112 mEq/L   CO2 39 (*) 19 - 32 mEq/L   Glucose, Bld 121 (*) 70 - 99 mg/dL   BUN  19  6 - 23 mg/dL   Creatinine, Ser 2.95  0.50 - 1.35 mg/dL   Calcium 9.1  8.4 - 28.4 mg/dL   Total Protein 7.2  6.0 - 8.3 g/dL   Albumin 4.1  3.5 - 5.2 g/dL   AST 21  0 - 37 U/L   ALT 23  0 - 53 U/L   Alkaline Phosphatase 49  39 - 117 U/L   Total Bilirubin 0.3  0.3 - 1.2 mg/dL   GFR calc non Af Amer 57 (*) >90 mL/min   GFR calc Af Amer 66 (*) >90 mL/min  TROPONIN I     Status: Normal   Collection Time   09/08/11  8:55 PM      Component Value Range   Troponin I <0.30  <0.30 ng/mL  D-DIMER, QUANTITATIVE     Status: Normal  Collection Time   09/08/11  8:55 PM      Component Value Range   D-Dimer, Quant <0.22  0.00 - 0.48 ug/mL-FEU  POCT I-STAT 3, BLOOD GAS (G3+)     Status: Abnormal   Collection Time   09/08/11  8:55 PM      Component Value Range   pH, Arterial 7.270 (*) 7.350 - 7.450   pCO2 arterial 85.5 (*) 35.0 - 45.0 mmHg   pO2, Arterial 100.0  80.0 - 100.0 mmHg   Bicarbonate 39.3 (*) 20.0 - 24.0 mEq/L   TCO2 42  0 - 100 mmol/L   O2 Saturation 96.0     Acid-Base Excess 9.0 (*) 0.0 - 2.0 mmol/L   Patient temperature 98.6 F     Collection site RADIAL, ALLEN'S TEST ACCEPTABLE     Drawn by RT     Sample type ARTERIAL     Comment NOTIFIED PHYSICIAN    BLOOD GAS, ARTERIAL     Status: Abnormal   Collection Time   09/09/11  4:33 AM      Component Value Range   FIO2 0.36     Delivery systems CONTINUOUS POSITIVE AIRWAY PRESSURE     pH, Arterial 7.245 (*) 7.350 - 7.450   pCO2 arterial 85.3 (*) 35.0 - 45.0 mmHg   pO2, Arterial 72.5 (*) 80.0 - 100.0 mmHg   Bicarbonate 35.7 (*) 20.0 - 24.0 mEq/L   TCO2 38.3  0 - 100 mmol/L   Acid-Base Excess 8.5 (*) 0.0 - 2.0 mmol/L   O2 Saturation 92.6     Patient temperature 98.6     Collection site RIGHT RADIAL     Drawn by [id number]     Sample type ARTERIAL DRAW     Allens test (pass/fail) PASS  PASS    Studies/Results: Dg Chest 2 View  09/08/2011  *RADIOLOGY REPORT*  Clinical Data: Shortness of breath with difficulty breathing.  CHEST - 2 VIEW   Comparison: None.  Findings: Patient is status post Harrington rod thoracolumbar rod fixation.  There is a moderate convex right scoliosis.  There are old right-sided rib fractures with chest wall deformity.  The heart size and mediastinal contours appear normal allowing for the scoliosis.  The lungs are clear.  There is no pleural effusion or pneumothorax.  IMPRESSION: Scoliosis and old right-sided rib fractures.  No acute cardiopulmonary process.   Original Report Authenticated By: Gerrianne Scale, M.D.    Dg Chest Port 1 View  09/09/2011  *RADIOLOGY REPORT*  Clinical Data: Shortness of breath, respiratory distress, history sleep apnea, hypertension  PORTABLE CHEST - 1 VIEW  Comparison: Portable exam 0605 hours compared to 09/08/2011  Findings: Dextroconvex thoracic scoliosis post spinal rod placement. Normal heart size, mediastinal contours and pulmonary vascularity for technique. Increased markings in right lower lobe since previous exam question developing infiltrate. Remaining lungs clear. No pleural effusion or pneumothorax.  IMPRESSION: Scoliosis. Question developing right lower lobe infiltrate.   Original Report Authenticated By: Lollie Marrow, M.D.     Scheduled Meds:   . albuterol  2.5 mg Nebulization Q6H  . albuterol  5 mg Nebulization Once  . aspirin  325 mg Oral Daily  . atorvastatin  80 mg Oral q1800  . budesonide  0.5 mg Nebulization BID  . enoxaparin (LOVENOX) injection  40 mg Subcutaneous Q24H  .  HYDROmorphone (DILAUDID) injection  0.5 mg Intravenous Once  . ipratropium  0.5 mg Nebulization Once  . ipratropium  0.5 mg Nebulization Q6H  .  l-methylfolate-B6-B12  1 tablet Oral Daily  . levothyroxine  125 mcg Oral Daily  . lisinopril  2.5 mg Oral Daily  . methylPREDNISolone (SOLU-MEDROL) injection  125 mg Intravenous Once  . methylPREDNISolone (SOLU-MEDROL) injection  40 mg Intravenous Q12H  . moxifloxacin  400 mg Intravenous Q24H  . pantoprazole  40 mg Oral Q1200  .  pneumococcal 23 valent vaccine  0.5 mL Intramuscular Tomorrow-1000  . propranolol  120 mg Oral QHS  . sodium chloride  3 mL Intravenous Q12H   Continuous Infusions:  PRN Meds:acetaminophen, acetaminophen, albuterol, ondansetron (ZOFRAN) IV, ondansetron  Assessment/Plan: Hypercarbia - Per Dr. Toniann Fail will transfer patient and place on Bipap.   LOS: 1 day   Roslin Norwood A.

## 2011-09-09 NOTE — Consult Note (Signed)
Name: Richard Padilla MRN: [id number] DOB: Aug 14, 1945    LOS: 1  Referring Provider:  Nancy Marus  Reason for Referral:  Resp Distress   PULMONARY / CRITICAL CARE MEDICINE  HPI:  66 yo AAM with known history of sleep apnea on CPAP, hypertension, hyperlipidemia and previous history of cigarette smoking is visiting [name] and has been here for last 3 months. During this period patient has not used his CPAP as he left it back in his home. Patient has been gradually getting short of breath over the last 3 weeks. Has nonproductive cough. Denies any chest pain or fever. Patient presented to the ER at [facility] 9/1  Over there patient was found to be wheezing and was placed on nebulizer and steroids. After which patient's condition significantly improved and admitted for further management. Patient at this time is not in acute distress and is able to talk without any difficulty. Pt was found to have hypercarbia w/ PCO2 85 despite CPAP x 1 hr . Changed over to BIPAP  CXR showed atx vs early RLL infiltrate, started on Avelox  Tx for AECOPD with steroids and nebs  PCCM asked to see pt in ICU .  Pt says he has been having progressive Dyspnea x 2 weeks with wheezing . Using OTC rescue inhaler for last 2 weeks several times a day.  No previous dx of asthma or COPD .     Past Medical History  Diagnosis Date  . Sleep apnea   . Hypertension   . Thyroid disease   . Hypercholesteremia   . Migraines    Past Surgical History  Procedure Date  . Back surgery   . Thyroid surgery    Prior to Admission medications   Medication Sig Start Date End Date Taking? Authorizing Provider  acetaminophen (TYLENOL) 500 MG tablet Take 1,000 mg by mouth every 6 (six) hours as needed. For migraine   Yes Historical Provider, MD  aspirin 325 MG tablet Take 325 mg by mouth daily.   Yes Historical Provider, MD  ibuprofen (ADVIL,MOTRIN) 200 MG tablet Take 400 mg by mouth every 6 (six) hours as needed. For  migraine.   Yes Historical Provider, MD  l-methylfolate-B6-B12 (METANX) 3-35-2 MG TABS Take 1 tablet by mouth daily.   Yes Historical Provider, MD  levothyroxine (SYNTHROID, LEVOTHROID) 125 MCG tablet Take 125 mcg by mouth daily.   Yes Historical Provider, MD  lisinopril (PRINIVIL,ZESTRIL) 2.5 MG tablet Take 2.5 mg by mouth daily.   Yes Historical Provider, MD  meloxicam (MOBIC) 15 MG tablet Take 7.5 mg by mouth daily.   Yes Historical Provider, MD  Multiple Vitamins-Minerals (ALIVE MENS ENERGY) TABS Take 1 tablet by mouth daily.   Yes Historical Provider, MD  omeprazole (PRILOSEC) 20 MG capsule Take 20 mg by mouth daily.   Yes Historical Provider, MD  propranolol (INNOPRAN XL) 120 MG 24 hr capsule Take 120 mg by mouth at bedtime.   Yes Historical Provider, MD  rosuvastatin (CRESTOR) 40 MG tablet Take 40 mg by mouth daily.   Yes Historical Provider, MD   Allergies No Known Allergies  Family History History reviewed. No pertinent family history. Social History  reports that he quit smoking about 20 years ago. His smoking use included Cigarettes. He has a 40 pack-year smoking history. He does not have any smokeless tobacco history on file. He reports that he drinks alcohol. He reports that he does not use illicit drugs.  Review Of Systems:   Constitutional:   No  weight loss, night sweats,  Fevers, chills,  +fatigue, or  lassitude.  HEENT:   No headaches,  Difficulty swallowing,  Tooth/dental problems, or  Sore throat,                No sneezing, itching, ear ache, nasal congestion, post nasal drip,   CV:  No chest pain,  Orthopnea, PND,   anasarca,  palpitations, syncope.  +dizziness   GI  No heartburn, indigestion, abdominal pain, nausea, vomiting, diarrhea,  , loss of appetite, bloody stools.  +constipation , bloating   Resp: No shortness of breath with exertion or at rest.  No excess mucus, no productive cough,  No non-productive cough,  No coughing up of blood.  No change in color  of mucus.  No wheezing.  No chest wall deformity  Skin: no rash or lesions.  GU: no dysuria, change in color of urine, no urgency or frequency.  No flank pain, no hematuria   MS:  No joint pain or swelling.  No decreased range of motion.  No back pain.  Psych:  No change in mood or affect. No depression or anxiety.  No memory loss.       Brief patient description:  Richard Padilla admitted 9/2 with presumed AECOPD (no previous dx of COPD prior to admit ) and Hypercarbic Resp Failure placed on BIPAP . Hx of OSA , not used CPAP x 3 mon prior to admit .  [name] consult 9/2.   Events Since Admission:   Current Status: On BIPAP w. Decreased wheezing and dsypnea   Vital Signs: Temp:  [97.6 F (36.4 C)-98.6 F (37 C)] 97.6 F (36.4 C) (09/02 0755) Pulse Rate:  [60-85] 61  (09/02 0752) Resp:  [13-28] 28  (09/02 0752) BP: (105-158)/(69-100) 131/75 mmHg (09/02 0752) SpO2:  [94 %-100 %] 97 % (09/02 0752) Weight:  [105.235 kg (232 lb)-108.9 kg (240 lb 1.3 oz)] 108.9 kg (240 lb 1.3 oz) (09/02 0645)  Physical Examination: General: obese, on BIPAP -appears comfortable  Neuro:  Intact, no focal deficits noted, a/ox 3  HEENT: nml, BIPAP in place  Neck:  Soft, no adenopathy/JVD  Cardiovascular:  NSR no m/r/g, tr edema  Lungs:  Coarse BS , diminshed in bases  Abdomen: obese , distended, decreased BS  Musculoskeletal:  Intact  Skin:  Intact   Principal Problem:  *COPD with acute exacerbation Active Problems:  HTN (hypertension)  Hypothyroidism  Hyperlipidemia  OSA on CPAP   ASSESSMENT AND PLAN  PULMONARY  Lab 09/09/11 0433 09/08/11 2055  PHART 7.245* 7.270*  PCO2ART 85.3* 85.5*  PO2ART 72.5* 100.0  HCO3 35.7* 39.3*  O2SAT 92.6 96.0   Ventilator Settings:   CXR: right basilar atx vs early infiltrate  ETT:  None   A:  Acute Hypoxic /Hypercarbic Resp Failure  Possible CAP >early RLL infiltrate on cxr  Presumed COPD exacerbation -no formal dx prior to admit  OSA  -decompensated due to not wearing CPAP  . Suspect component of OHS  >D.Dimer neg, troponin ntd   P:   Continue BIPAP support  Repeat ABG now and in am  CXR in am  Continue on albuterol Reita May /pulmicort neb  Continue on solumedrol -increase 80mg  Q12  Check bnp -if elevated consider echo  Will need OP w/up for COPD w/ PFTs      CARDIOVASCULAR  Lab 09/09/11 0937 09/09/11 0635 09/08/11 2055  TROPONINI -- <0.30 <0.30  LATICACIDVEN -- -- --  PROBNP 63.8 -- --  ECG:  NSR  Lines: none   A: HTN -on propanolol and ACE prior to admit  EKG w/ no acute changes, Troponin cycle x 2  R/o volume overload , at risk for pulmonary HTN w/ underlying decompensated OSA   P:  Check BNP Consider echo if bnp elevated  STaftt niote: Would not give home ACE inhibitor, may use ARB if b/p rises (at discharg) STaff note: Would change propanolol to Bisoprolol due to wheezing at discharge (currently held)  RENAL  Lab 09/08/11 2055  NA 143  K 4.5  CL 101  CO2 39*  BUN 19  CREATININE 1.30  CALCIUM 9.1  MG --  PHOS --   Intake/Output      09/01 0701 - 09/02 0700 09/02 0701 - 09/03 0700        Urine Occurrence     Stool Occurrence      Foley:  None   A:  Renal Insufficiency -?baseline scr  P:   ACE on hold  Repeat bmet in am    GASTROINTESTINAL  Lab 09/08/11 2055  AST 21  ALT 23  ALKPHOS 49  BILITOT 0.3  PROT 7.2  ALBUMIN 4.1    A:  Abdominal distention , constipation  P:   Per PCP  May be contributing to hypoventilation    HEMATOLOGIC  Lab 09/09/11 0630 09/08/11 2055  HGB 13.2 13.0  HCT 43.5 42.2  PLT 193 178  INR -- --  APTT -- --   A:  No apparent issues  P: Continue to monitor   INFECTIOUS  Lab 09/09/11 0630 09/08/11 2055  WBC 6.4 5.5  PROCALCITON -- --   Cultures:  Antibiotics: 9/2 Avelox   A:  Possible CAP -with atx vs RLL infiltrate on xray  However afebrile and no bump in wbc  P:   Continue abx for now  repeat xray in am    ENDOCRINE No results found for this basename: GLUCAP:5 in the last 168 hours A:  Hyperglycemia  P:   Per PCP  Monitor sugars closely as on steroids   NEUROLOGIC  A:  No apparent active issues  P:   Cont to monitor   BEST PRACTICE / DISPOSITION Level of Care:  SDU  Primary Service:  IM  Consultants:   Code Status: full  Diet:  Heart Healthy  DVT Px:  Lovenox  GI Px:  PPI  Skin Integrity:  Intact  Social / Family:  Updated at bedside   PARRETT,TAMMY, NP  Pulmonary and Critical Care Medicine [employer] Pager: [phone]  09/09/2011, 8:05 AM    STAFF NOTE: I, Dr Lavinia Sharps have personally reviewed patient's available data, including medical history, events of note, physical examination and test results as part of my evaluation. I have discussed with resident/NP and other care providers such as pharmacist, RN and RRT.  In addition,  I personally evaluated patient and elicited key findings of acute resp failure due to acute resp acidosis due to displacment of chronic cpap machine and RLL CAP (likely CAP). Improved with bipap. BNP normal. Will check phos and mag and repeat abg. Will check urine strep antigen. He wants auto cpap at discharge.  Rest per NP/medical resident whose note is outlined above and that I agree with  The patient is critically ill with multiple organ systems failure and requires high complexity decision making for assessment and support, frequent evaluation and titration of therapies, application of advanced monitoring technologies and extensive interpretation of multiple databases.  Critical Care Time devoted to patient care services described in this note is  31 Minutes.  Dr. Kalman Shan, M.D., [facility].C.P Pulmonary and Critical Care Medicine Staff Physician [facility] Pager: [phone], If no answer or between  15:00h - 7:00h: call [phone]  09/09/2011 3:57 PM

## 2011-09-10 ENCOUNTER — Telehealth: Payer: Self-pay | Admitting: *Deleted

## 2011-09-10 ENCOUNTER — Inpatient Hospital Stay (HOSPITAL_COMMUNITY)

## 2011-09-10 DIAGNOSIS — G4733 Obstructive sleep apnea (adult) (pediatric): Secondary | ICD-10-CM

## 2011-09-10 LAB — LEGIONELLA ANTIGEN, URINE

## 2011-09-10 LAB — CBC
HCT: 38.9 % — ABNORMAL LOW (ref 39.0–52.0)
Hemoglobin: 11.9 g/dL — ABNORMAL LOW (ref 13.0–17.0)
RBC: 4.2 MIL/uL — ABNORMAL LOW (ref 4.22–5.81)
WBC: 10.8 10*3/uL — ABNORMAL HIGH (ref 4.0–10.5)

## 2011-09-10 LAB — PHOSPHORUS: Phosphorus: 4 mg/dL (ref 2.3–4.6)

## 2011-09-10 LAB — GLUCOSE, CAPILLARY: Glucose-Capillary: 312 mg/dL — ABNORMAL HIGH (ref 70–99)

## 2011-09-10 LAB — BASIC METABOLIC PANEL
BUN: 21 mg/dL (ref 6–23)
Chloride: 97 mEq/L (ref 96–112)
Glucose, Bld: 190 mg/dL — ABNORMAL HIGH (ref 70–99)
Potassium: 4.2 mEq/L (ref 3.5–5.1)

## 2011-09-10 MED ORDER — GUAIFENESIN 200 MG PO TABS
200.0000 mg | ORAL_TABLET | ORAL | Status: DC | PRN
  Filled 2011-09-10: qty 1

## 2011-09-10 MED ORDER — METHYLPREDNISOLONE SODIUM SUCC 125 MG IJ SOLR
80.0000 mg | Freq: Every day | INTRAMUSCULAR | Status: DC
  Administered 2011-09-11: 80 mg via INTRAVENOUS
  Filled 2011-09-10: qty 1.28

## 2011-09-10 MED ORDER — INSULIN GLARGINE 100 UNIT/ML ~~LOC~~ SOLN
10.0000 [IU] | Freq: Every day | SUBCUTANEOUS | Status: DC
  Administered 2011-09-10 – 2011-09-12 (×3): 10 [IU] via SUBCUTANEOUS

## 2011-09-10 MED ORDER — POLYETHYLENE GLYCOL 3350 17 G PO PACK
68.0000 g | PACK | ORAL | Status: DC
  Filled 2011-09-10 (×5): qty 4

## 2011-09-10 MED ORDER — POLYETHYLENE GLYCOL 3350 17 G PO PACK
17.0000 g | PACK | ORAL | Status: DC
  Administered 2011-09-10 – 2011-09-13 (×15): 17 g via ORAL
  Filled 2011-09-10 (×22): qty 1

## 2011-09-10 MED ORDER — INSULIN ASPART 100 UNIT/ML ~~LOC~~ SOLN
3.0000 [IU] | Freq: Three times a day (TID) | SUBCUTANEOUS | Status: DC
  Administered 2011-09-10 – 2011-09-13 (×10): 3 [IU] via SUBCUTANEOUS

## 2011-09-10 MED ORDER — BISACODYL 5 MG PO TBEC
10.0000 mg | DELAYED_RELEASE_TABLET | Freq: Once | ORAL | Status: AC
  Administered 2011-09-10: 10 mg via ORAL
  Filled 2011-09-10: qty 2

## 2011-09-10 MED ORDER — IBUPROFEN 600 MG PO TABS
600.0000 mg | ORAL_TABLET | Freq: Four times a day (QID) | ORAL | Status: DC | PRN
  Administered 2011-09-10: 600 mg via ORAL
  Filled 2011-09-10: qty 1

## 2011-09-10 NOTE — Progress Notes (Signed)
[facility] PROGRESS NOTE  Richard Padilla AVW:[id number] DOB: 07/13/1945 DOA: 09/08/2011 PCP: No primary provider on file.  Assessment/Plan: Principal Problem:  *COPD with acute exacerbation Active Problems:  HTN (hypertension)  Hypothyroidism  Hyperlipidemia  OSA on CPAP  Constipation  Hyperglycemia  Acute respiratory failure with hypercapnia  COPD with acute exacerbation and hypercapneic respiratory failure.  ABG mildly improved after one to two hours of BIPAP 16/6, but persistently acidotic.  Patient did not want to wear bipap yesterday despite conversation about importance, but agreed to wear it overnight.  Was wearing [place] this morning, 3L and desaturating to 88%.  Patient mentating well without confusion.  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 -  ABG in 1 hour, if acidotic, would restart bipap -  Duonebs q6h with alb q2h prn  -  Continue Methylpred 80mg  iv q12h -  Continue pulmicort.  Will need LABA and antichol at discharge -  Moxifloxacin day 2 -  Consider transfer out of ICU when no longer requiring bipap prn for respiratory acidosis -  Appreciate pulmology recommendations  Hyperglycemia, hx of diet controlled diabetes.   -  A1c -  Sliding scale insulin -  Increase lantus to 10 units  Hypertension, blood pressure well controlled. -  D/C ACEI and nonselective beta blocker -  While NPO, hydralazine 10mg  IV for SBP > 160 or DBP > 100 -  Will start bisoprolol or metoprolol pending improving +/- ARB (given diabetes) -  Continue ASA (take when no longer wearing bipap mask)  Back pain: -  Tylenol prn -  Ibuprofen prn  Hyperlipidemia:   -  Restart statin  Hypothyroidism: -  Continue synthroid  GERD:   -  Protonix while inpatient (for GERD and GI proph while on high dose steroids)  DIET:  Healthy heart, diabetic, B6, B12 complex ACCESS:  PIV PROPH:  Lovenox  Code Status: Full Family Communication: Spoke with patient  Disposition Plan: Will  need outpatient PFTs.  Continue current therapy   Brief narrative:  66 year-old male with known history of sleep apnea on CPAP, hypertension, hyperlipidemia and previous history of cigarette smoking is visiting [place] and has been here for last 3 months. During this period patient has not used his CPAP as he left it back in his home. Patient has been gradually getting Goerge Mohr of breath over the last 3 weeks. Has nonproductive cough. Denies any chest pain or fever. Patient presented to the ER at [facility]. Over there patient was found to be wheezing and was placed on nebulizer and steroids. After which patient's condition significantly improved and admitted for further management. Patient at this time is not in acute distress and is able to talk without any difficulty.  Consultants:  Pulmonology  Procedures:  Bipap  Antibiotics:  Moxifloxacin 9/2 >>  HPI/Subjective:  Patient states he is feeling much better this morning.  He does not like the bipap.  He has come cough that feels congested but he is not able to cough anything up at this time.  Denies chest pain, nausea, vomiting, constipation, diarrhea.  Appetite is good.  He has some chronic back pain for which he received a dose of tylenol   Objective: Filed Vitals:   09/10/11 0419 09/10/11 0500 09/10/11 0725 09/10/11 0800  BP:    115/69  Pulse: 63     Temp:    97.8 F (36.6 C)  TempSrc:    Oral  Resp: 14     Height:  Weight:  107.6 kg (237 lb 3.4 oz)    SpO2: 96%  95%     Intake/Output Summary (Last 24 hours) at 09/10/11 0845 Last data filed at 09/10/11 0340  Gross per 24 hour  Intake   1330 ml  Output    550 ml  Net    780 ml   Filed Weights   09/09/11 0120 09/09/11 0645 09/10/11 0500  Weight: 106.8 kg (235 lb 7.2 oz) 108.9 kg (240 lb 1.3 oz) 107.6 kg (237 lb 3.4 oz)    Exam:   General:  Obese AAM, sitting at edge of bed conversant  HEENT:  Nasal canula in place.  PERRL, anicteric  Neck:   Supple  Cardiovascular: RRR, no murmurs  Respiratory:  Diminished BS at the bilateral bases.  No focal rales or rhonchi.  Absent wheeze except anteriorly and very high pitched.  I:E 1:5   Abdomen: NABS, distended, soft, nontender, obese  Ext:  2+ pulses, no LEE  Psych:  Very alert and interactive.  A&Ox4  Data Reviewed: Basic Metabolic Panel:  Lab 09/10/11 1610 09/09/11 0630 09/08/11 2055  NA 138 141 143  K 4.2 4.8 4.5  CL 97 98 101  CO2 36* 34* 39*  GLUCOSE 190* 300* 121*  BUN 21 18 19   CREATININE 1.15 1.13 1.30  CALCIUM 9.1 9.2 9.1  MG 1.9 -- --  PHOS 4.0 -- --   Liver Function Tests:  Lab 09/09/11 0630 09/08/11 2055  AST 18 21  ALT 23 23  ALKPHOS 51 49  BILITOT 0.2* 0.3  PROT 7.2 7.2  ALBUMIN 3.8 4.1   No results found for this basename: LIPASE:5,AMYLASE:5 in the last 168 hours No results found for this basename: AMMONIA:5 in the last 168 hours CBC:  Lab 09/10/11 0534 09/09/11 0630 09/08/11 2055  WBC 10.8* 6.4 5.5  NEUTROABS -- 5.8 2.9  HGB 11.9* 13.2 13.0  HCT 38.9* 43.5 42.2  MCV 92.6 92.9 90.8  PLT 181 193 178   Cardiac Enzymes:  Lab 09/09/11 0635 09/08/11 2055  CKTOTAL -- --  CKMB -- --  CKMBINDEX -- --  TROPONINI <0.30 <0.30   BNP (last 3 results)  Basename 09/09/11 0937  PROBNP 63.8   CBG:  Lab 09/10/11 0810 09/09/11 2128 09/09/11 1711 09/09/11 1155 09/09/11 0959  GLUCAP 197* 250* 229* 188* 207*    Recent Results (from the past 240 hour(s))  MRSA PCR SCREENING     Status: Normal   Collection Time   09/09/11  6:59 AM      Component Value Range Status Comment   MRSA by PCR NEGATIVE  NEGATIVE Final      Studies: Dg Chest 2 View  09/08/2011  *RADIOLOGY REPORT*  Clinical Data: Shortness of breath with difficulty breathing.  CHEST - 2 VIEW  Comparison: None.  Findings: Patient is status post Harrington rod thoracolumbar rod fixation.  There is a moderate convex right scoliosis.  There are old right-sided rib fractures with chest wall  deformity.  The heart size and mediastinal contours appear normal allowing for the scoliosis.  The lungs are clear.  There is no pleural effusion or pneumothorax.  IMPRESSION: Scoliosis and old right-sided rib fractures.  No acute cardiopulmonary process.   Original Report Authenticated By: Gerrianne Scale, M.D.    Dg Chest Port 1 View  09/09/2011  *RADIOLOGY REPORT*  Clinical Data: Shortness of breath, respiratory distress, history sleep apnea, hypertension  PORTABLE CHEST - 1 VIEW  Comparison: Portable exam 0605 hours  compared to 09/08/2011  Findings: Dextroconvex thoracic scoliosis post spinal rod placement. Normal heart size, mediastinal contours and pulmonary vascularity for technique. Increased markings in right lower lobe since previous exam question developing infiltrate. Remaining lungs clear. No pleural effusion or pneumothorax.  IMPRESSION: Scoliosis. Question developing right lower lobe infiltrate.   Original Report Authenticated By: Lollie Marrow, M.D.     Scheduled Meds:    . albuterol  2.5 mg Nebulization Q6H  . aspirin  325 mg Oral Daily  . atorvastatin  80 mg Oral q1800  . budesonide  0.5 mg Nebulization BID  . enoxaparin (LOVENOX) injection  40 mg Subcutaneous Q24H  . insulin aspart  0-15 Units Subcutaneous TID WC  . insulin aspart  0-5 Units Subcutaneous QHS  . insulin glargine  5 Units Subcutaneous QHS  . ipratropium  0.5 mg Nebulization Q6H  . l-methylfolate-B6-B12  1 tablet Oral Daily  . levothyroxine  125 mcg Oral Daily  . methylPREDNISolone (SOLU-MEDROL) injection  80 mg Intravenous Q12H  . moxifloxacin  400 mg Intravenous Q24H  . pantoprazole  40 mg Oral Q1200  . pneumococcal 23 valent vaccine  0.5 mL Intramuscular Tomorrow-1000  . sodium chloride  3 mL Intravenous Q12H  . DISCONTD: bisoprolol  10 mg Oral Daily  . DISCONTD: insulin glargine  5 Units Subcutaneous QHS  . DISCONTD: propranolol  120 mg Oral QHS   Continuous Infusions:   Principal Problem:  *COPD  with acute exacerbation Active Problems:  HTN (hypertension)  Hypothyroidism  Hyperlipidemia  OSA on CPAP  Constipation  Hyperglycemia  Acute respiratory failure with hypercapnia    Time spent: 45    Zailey Audia, [facility]  [facility] Pager [phone]. If 8PM-8AM, please contact night-coverage at [email or web address], password [facility] 09/10/2011, 8:45 AM  LOS: 2 days

## 2011-09-10 NOTE — Progress Notes (Signed)
Name: Richard Padilla MRN: [id number] DOB: 02-11-1945    LOS: 2  Referring Provider:  Nancy Marus  Reason for Referral:  Resp Distress   PULMONARY / CRITICAL CARE MEDICINE  Brief patient description:  66 yo AAM former smoker admitted 9/2 with presumed AECOPD (no previous dx of COPD prior to admit ) and Hypercarbic Resp Failure placed on BIPAP . Hx of OSA , not used CPAP x 3 mon prior to admit .  [name] consult 9/2.   Events Since Admission: 9/2 briefly required BIPAP  Current Status: Feeling much better this morning, still has some chest congestion  Vital Signs: Temp:  [97.7 F (36.5 C)-98.8 F (37.1 C)] 97.8 F (36.6 C) (09/03 0800) Pulse Rate:  [60-78] 63  (09/03 0419) Resp:  [12-28] 14  (09/03 0419) BP: (104-137)/(63-75) 115/69 mmHg (09/03 0800) SpO2:  [91 %-97 %] 95 % (09/03 0725) FiO2 (%):  [40 %] 40 % (09/03 0340) Weight:  [107.6 kg (237 lb 3.4 oz)] 107.6 kg (237 lb 3.4 oz) (09/03 0500)  Physical Examination: Gen: well appearing, no acute distress HEENT: NCAT, PERRL, EOMi,  PULM: Wheezing, poor air movement bilaterally CV: RRR, no mgr, no JVD AB: BS+, soft, nontender, no hsm Ext: warm, trace edema, no clubbing, no cyanosis Derm: no rash or skin breakdown Neuro: A&Ox4, MAEW   Principal Problem:  *COPD with acute exacerbation Active Problems:  HTN (hypertension)  Hypothyroidism  Hyperlipidemia  OSA on CPAP  Constipation  Hyperglycemia  Acute respiratory failure with hypercapnia   ASSESSMENT AND PLAN  PULMONARY  Lab 09/09/11 1615 09/09/11 0835 09/09/11 0433 09/08/11 2055  PHART 7.339* 7.274* 7.245* 7.270*  PCO2ART 66.3* 79.2* 85.3* 85.5*  PO2ART 64.2* 77.4* 72.5* 100.0  HCO3 34.8* 35.5* 35.7* 39.3*  O2SAT 91.4 93.5 92.6 96.0   Ventilator Settings: Vent Mode:  [-]  FiO2 (%):  [40 %] 40 % CXR: right basilar atx vs early infiltrate  ETT:  None   A:   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  Possible CAP >early RLL infiltrate on cxr    COPD exacerbation OSA -decompensated due to not wearing CPAP Chronic respiratory failure (as evidenced by chronic baseline hypercarbia out of proportion to acute changes) due to COPD and likely Obesity Hypoventilation Syndrome  P:   Continue BIPAP support qHS, will need this as an outpatient Needs outpatient sleep and pulmonary follow up, will make arrangements for follow up with [facility] No more ABG's Continue on albuterol Reita May /pulmicort neb in hospital Wean solumedrol -wean to 80mg  daily, then would transition to 14 day taper of prednisone starting 9/4 Outpatient regimen: Dulera 100/5 2 puffs bid, albuterol PRN (Dulera on [place] formulary) BIPAP: full face mask, IPAP 16, EPAP 6, FiO2 40%   CARDIOVASCULAR  Lab 09/09/11 0937 09/09/11 0635 09/08/11 2055  TROPONINI -- <0.30 <0.30  LATICACIDVEN -- -- --  PROBNP 63.8 -- --   ECG:  NSR  Lines: none   A:  HTN -on propanolol and ACE prior to admit  EKG w/ no acute changes, Troponin cycle x 2  Not volume overloaded, BNP normal   P:   Consider change home ACE inhibitor to ARB (at discharg) Change propanolol to Bisoprolol due to wheezing at discharge (currently held)  INFECTIOUS  Lab 09/10/11 0534 09/09/11 0630 09/08/11 2055  WBC 10.8* 6.4 5.5  PROCALCITON -- -- --   Cultures:  Antibiotics: 9/2 Avelox   A:  Possible CAP -with atx vs RLL infiltrate on xray  However afebrile, WBC elevation likely  related to steroid P:   Continue abx for now   BEST PRACTICE / DISPOSITION Level of Care:  SDU  Primary Service:  IM  Consultants:   Code Status: full  Diet:  Heart Healthy  DVT Px:  Lovenox  GI Px:  PPI  Skin Integrity:  Intact  Social / Family:  Updated at bedside   Yolonda Kida PCCM Pager: [phone] Cell: [phone] If no response, call [phone]   09/10/2011 10:22 AM

## 2011-09-10 NOTE — Telephone Encounter (Signed)
Message copied by Darrell Jewel on Tue Sep 10, 2011  4:16 PM ------      Message from: Richard Padilla      Created: Tue Sep 10, 2011 10:44 AM       Hi,            This patient needs a hospital follow up appointment in 2 weeks.  He saw Dr. Colletta Maryland in the hospital as well as me.  He lives in [place] so he doesn't want to go to [place].            [employer]

## 2011-09-10 NOTE — Progress Notes (Signed)
Inpatient Diabetes Program Recommendations  AACE/ADA: New Consensus Statement on Inpatient Glycemic Control (2013)  Target Ranges:  Prepandial:   less than 140 mg/dL      Peak postprandial:   less than 180 mg/dL (1-2 hours)      Critically ill patients:  140 - 180 mg/dL   Results for TUCK, DULWORTH (MRN [id number]) as of 09/10/2011 09:33  Ref. Range 09/09/2011 09:59 09/09/2011 11:55 09/09/2011 17:11 09/09/2011 21:28  Glucose-Capillary Latest Range: 70-99 mg/dL 409 (H) 811 (H) 914 (H) 250 (H)   Noted Lantus increased to 10 units QHS today (will get increased dose tonight).  Postprandial CBGs also elevated.  Inpatient Diabetes Program Recommendations Insulin - Meal Coverage: Please consider adding low dose meal coverage while patient on IV steroids- Novolog 3 units tid with meals.  Note: Will follow. Ambrose Finland RN, MSN, CDE Diabetes Coordinator Inpatient Diabetes Program [phone]

## 2011-09-10 NOTE — Progress Notes (Signed)
Utilization review completed.  

## 2011-09-10 NOTE — Care Management Note (Signed)
Page 1 of 2   09/11/2011     4:51:36 PM   CARE MANAGEMENT NOTE 09/11/2011  Patient:  Richard Padilla, Richard Padilla   Account Number:  [id number]  Date Initiated:  09/10/2011  Documentation initiated by:  [name]  Subjective/Objective Assessment:   Pt admitted with Acute exacerbation of COPD -respiratory acidosis     Action/Plan:   PTA pt lived at home, is here staying with friend lives in [place], independent   Anticipated DC Date:  09/12/2011   Anticipated DC Plan:  HOME/SELF CARE      DC Planning Services  CM consult      Choice offered to / List presented to:  C-1 Patient   DME arranged  BIPAP      DME agency  [facility]        Status of service:  In process, will continue to follow Medicare Important Message given?   (If response is "NO", the following Medicare IM given date fields will be blank) Date Medicare IM given:   Date Additional Medicare IM given:    Discharge Disposition:    Per UR Regulation:  Reviewed for med. necessity/level of care/duration of stay  If discussed at Long Length of Stay Meetings, dates discussed:    Comments:  PCP- Dr.Sandu  (sees the [place] back in [place])  09/11/11- 1230- [name] RN, BSN [phone] Follow up done with [facility] both in [place] and in [place], per conversation with NP- Sue with the [facility] sleep center- pt has had a sleep study done there in 2012- and  had a CPAP arranged through the [place]- she stated that pt had not followed up with them in the last year n half and need to f/u with them to assess the need for the change from cpap to bipap by the [place] for the pt to be able to use his VA benefits to cover the cost. She also informed this CM that there was nothing that the [place] could do to assist the pt while he was here since he is not in a [place] hospital. He would need to go to a [place] facility where they would have access to the records. Call also made to the [facility]- where there is a sleep center- was  informed that pt would need to come through the emergency room since he is not an established [facility] here in this state in order for them to assist with Bipap. Had [facility] liaison check into rental of a bipap and as the pt is out of state [facility] will not be able to provide pt with bipap for rental. Call made to [facility]- and spoke with Okey Regal Emergency planning/management officer) who states that they could assist with bipap for pt and that they have agencies in [place] too that pt could return the bipap to when he returns home. Rental would have to be per month at at cost of $246.73 with a credit card. If pt needed 02 - rental would be an extra $186.78 for the concentrator. Spoke with pt to explain all the information that was gathered both from the [place] and [place]- pt states that he understands the need to return to [place] in order to f/u with the VA and arrange long term Bipap with them in order for his insurance to cover- in the meantime- he wants to arrange bipap with [name]- and is agreeable to cost arrangements.  Call also made to pt's daughter to update her with pt's permission.  All above info shared  with unit [address] CM- [name] who is to f/u with arrangement of Bipap with [facility] prior to pt's discharge.  09/10/11- 1030- Derius Ghosh RN, BSN [phone] Spoke with pt at bedside regarding need for bipap- per conversation pt states that he has a cpap back home in [place]. and did not bring it with him- he states that he got his cpap through the [place] about 2 yrs ago. He reports that he goes to the [place] back home. He did not bring his CPAP because he says that it was not working properly. Explained to pt that insurance does not pay for both duplicate DME - and that he may have out of pocket expense. He may also need a sleep study to qualify for a BIPAP. Will have representative from [facility] come speak with pt regarding expense - (per [facility] - pt would have to pay about $800 out of pocket and with pt already having a CPAP through the [place]- they would not be  about to provide BIPAP) Pt will need to f/u with VA regarding getting a new CPAP vs BIPAP- call placed to the VA here to see about getting information regarding what pt needs to do- feel like best option would most likely be to return to [place] and f/u with PCP there. Call placed to [facility] [phone]) awaiting return call to see if there is anything that can be done from here to facilitate getting pt a bipap while here. NCM to cont. to follow.

## 2011-09-10 NOTE — Telephone Encounter (Signed)
I called the pt and he is still in the hospital. I advised the pt to call at discharge to schedule appt. Carron Curie, CMA

## 2011-09-11 DIAGNOSIS — I10 Essential (primary) hypertension: Secondary | ICD-10-CM

## 2011-09-11 DIAGNOSIS — R7309 Other abnormal glucose: Secondary | ICD-10-CM

## 2011-09-11 LAB — GLUCOSE, CAPILLARY
Glucose-Capillary: 133 mg/dL — ABNORMAL HIGH (ref 70–99)
Glucose-Capillary: 189 mg/dL — ABNORMAL HIGH (ref 70–99)

## 2011-09-11 LAB — BASIC METABOLIC PANEL
BUN: 22 mg/dL (ref 6–23)
CO2: 37 mEq/L — ABNORMAL HIGH (ref 19–32)
GFR calc non Af Amer: 66 mL/min — ABNORMAL LOW (ref >90)
Glucose, Bld: 161 mg/dL — ABNORMAL HIGH (ref 70–99)
Potassium: 4.1 mEq/L (ref 3.5–5.1)

## 2011-09-11 LAB — CBC
HCT: 38.8 % — ABNORMAL LOW (ref 39.0–52.0)
Hemoglobin: 12 g/dL — ABNORMAL LOW (ref 13.0–17.0)
MCH: 28.4 pg (ref 26.0–34.0)
MCHC: 30.9 g/dL (ref 30.0–36.0)
RBC: 4.22 MIL/uL (ref 4.22–5.81)

## 2011-09-11 MED ORDER — LOSARTAN POTASSIUM 25 MG PO TABS
25.0000 mg | ORAL_TABLET | Freq: Every day | ORAL | Status: DC
  Administered 2011-09-11 – 2011-09-13 (×3): 25 mg via ORAL
  Filled 2011-09-11 (×3): qty 1

## 2011-09-11 MED ORDER — PREDNISONE 20 MG PO TABS
40.0000 mg | ORAL_TABLET | Freq: Every day | ORAL | Status: DC
  Administered 2011-09-11 – 2011-09-13 (×3): 40 mg via ORAL
  Filled 2011-09-11 (×4): qty 2

## 2011-09-11 MED ORDER — PNEUMOCOCCAL VAC POLYVALENT 25 MCG/0.5ML IJ INJ
0.5000 mL | INJECTION | INTRAMUSCULAR | Status: AC
  Administered 2011-09-11: 0.5 mL via INTRAMUSCULAR
  Filled 2011-09-11: qty 0.5

## 2011-09-11 MED ORDER — INSULIN ASPART 100 UNIT/ML ~~LOC~~ SOLN: 3.0000 [IU] | Freq: Three times a day (TID) | SUBCUTANEOUS | Status: DC

## 2011-09-11 NOTE — Progress Notes (Signed)
Name: Richard Padilla MRN: [id number] DOB: 11/19/1945    LOS: 3  Referring Provider:  Nancy Marus  Reason for Referral:  Resp Distress   PULMONARY / CRITICAL CARE MEDICINE  Brief patient description:  66 yo AAM former smoker admitted 9/2 with presumed AECOPD (no previous dx of COPD prior to admit ) and Hypercarbic Resp Failure placed on BIPAP . Hx of OSA , not used CPAP x 3 mon prior to admit .  [name] consult 9/2.   Events Since Admission: 9/2 briefly required BIPAP  Current Status: Feels well.    Vital Signs: Temp:  [97.6 F (36.4 C)-98.2 F (36.8 C)] 97.9 F (36.6 C) (09/04 0816) Pulse Rate:  [64-69] 64  (09/04 0416) Resp:  [14-21] 14  (09/04 0349) BP: (124-135)/(78-83) 131/83 mmHg (09/04 0816) SpO2:  [89 %-98 %] 97 % (09/04 0416) FiO2 (%):  [40 %] 40 % (09/04 0416)  Physical Examination: Gen: well appearing, no acute distress HEENT: NCAT, PERRL, EOMi,  PULM: good air movement bilaterally, minimal wheezing CV: RRR, no mgr, no JVD AB: BS+, soft, nontender, no hsm Ext: warm, trace edema, no clubbing, no cyanosis    Principal Problem:  *COPD with acute exacerbation Active Problems:  HTN (hypertension)  Hypothyroidism  Hyperlipidemia  OSA on CPAP  Constipation  Hyperglycemia  Acute respiratory failure with hypercapnia   ASSESSMENT AND PLAN  PULMONARY  Lab 09/09/11 1615 09/09/11 0835 09/09/11 0433 09/08/11 2055  PHART 7.339* 7.274* 7.245* 7.270*  PCO2ART 66.3* 79.2* 85.3* 85.5*  PO2ART 64.2* 77.4* 72.5* 100.0  HCO3 34.8* 35.5* 35.7* 39.3*  O2SAT 91.4 93.5 92.6 96.0   Ventilator Settings: Vent Mode:  [-]  FiO2 (%):  [40 %] 40 % CXR: right basilar atx vs early infiltrate  ETT:  None   A:   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  Possible CAP >early RLL infiltrate on cxr  COPD exacerbation OSA -decompensated due to not wearing CPAP Chronic respiratory failure (as evidenced by chronic baseline hypercarbia out of proportion to acute  changes) due to COPD and likely Obesity Hypoventilation Syndrome  P:   Needs BIPAP full face mask, IPAP 16, EPAP 6 FiO2 35% at discharge then followed by outpatient sleep study Have consulted case management and placed DME order for this Have scheduled follow up with Rubye Oaks NP, 9/11 at 11:45; at that appointment he should have a sleep study scheduled and follow up with a sleep specialist ordered; also needs a PFT then  Change to prednisone today, 40mg  po daily, taper over 14 days At discharge, start Dulera 100/5 2 puffs bid, albuterol PRN (Dulera on [facility] formulary)  Continue on albuterol /atrovent /pulmicort neb in hospital   CARDIOVASCULAR  Lab 09/09/11 0937 09/09/11 0635 09/08/11 2055  TROPONINI -- <0.30 <0.30  LATICACIDVEN -- -- --  PROBNP 63.8 -- --   ECG:  NSR  Lines: none   A:  HTN -on propanolol and ACE prior to admit  EKG w/ no acute changes, Troponin cycle x 2  Not volume overloaded, BNP normal   P:   Consider change home ACE inhibitor to ARB (at discharg) Change propanolol to Bisoprolol due to wheezing at discharge (currently held)  INFECTIOUS  Lab 09/11/11 0540 09/10/11 0534 09/09/11 0630 09/08/11 2055  WBC 10.7* 10.8* 6.4 5.5  PROCALCITON -- -- -- --   Cultures:  Antibiotics: 9/2 Avelox   A:  Possible CAP -with atx vs RLL infiltrate on xray  However afebrile, WBC elevation likely related to steroid P:  Continue abx for now   BEST PRACTICE / DISPOSITION Level of Care:  SDU  Primary Service:  IM  Consultants:   Code Status: full  Diet:  Heart Healthy  DVT Px:  Lovenox  GI Px:  PPI  Skin Integrity:  Intact  Social / Family:  Updated at bedside   PCCM will sign off, call if questions  Yolonda Kida PCCM Pager: [phone] Cell: [phone] If no response, call [phone]   09/11/2011 9:40 AM

## 2011-09-11 NOTE — Progress Notes (Addendum)
Pt to [place] to [id number], VSS, will continue to monitor. Called report to [id number]. Notified RT per request.

## 2011-09-11 NOTE — Progress Notes (Signed)
Pt arrived to the unit with dx. COPD. Pt is alert and oriented x4. Ambulatory with stand by assist. Continent of bowel and bladder. No skin break down noted. VS stable. Pt denies any pain or distress. Oriented to staff and unit. Will cont to monitor.

## 2011-09-11 NOTE — Progress Notes (Signed)
[facility] Progress Note Cable TEAM 1 - Stepdown/ICU TEAM   Richard Padilla NGE:[id number] DOB: 04-29-1945 DOA: 09/08/2011 PCP: No primary provider on file.  Brief narrative: 66 year-old male with known history of sleep apnea on CPAP, hypertension, hyperlipidemia and previous history of cigarette smoking visiting the [place] area for last 3 months. During this period patient has not used his CPAP as he left it back home. Patient has been gradually getting short of breath over the last 3 weeks. Has nonproductive cough. Denied any chest pain or fever. Patient presented to the ER at [facility]. The patient was found to be wheezing and was placed on nebulizer and steroids.   Assessment/Plan:  Newly diagnosed COPD with acute exacerbation and hypercapneic respiratory failure - OSA on CPAP (noncompliant of late) -much improved at this point - no longer requiring daytime BIPAP  - Appreciate pulmology recommendations: "needs BIPAP full face mask, IPAP 16, EPAP 6 FiO2 35% at discharge then followed by outpatient sleep study - have scheduled follow up with Rubye Oaks NP, 9/11 at 11:45; at that appointment he should have a sleep study scheduled and follow up with a sleep specialist ordered; also needs a PFT then" -change to prednisone today, 40mg  po daily, taper over 14 days  -at discharge, start Dulera 100/5 2 puffs bid, albuterol PRN (Dulera on [facility] formulary) -discussed importance of QHS BIPAP compliance with pt and his sister -CM is investigating BIPAP for home use until pt returns to [place]   Possible CAP >early RLL infiltrate on cxr  -to complete a 7 day course of abx tx  Hyperglycemia, hx of diet controlled diabetes - A1c elevated at 7.4 - consider starting metformin at time of d/c  - Sliding scale insulin while intpatient - lantus 10 units   Hypertension, blood pressure well controlled - D/C nonselective beta blocker  - Will start ARB (given diabetes)  -  Continue ASA    Back pain - Tylenol prn  - Ibuprofen prn   Hyperlipidemia  - statin   Hypothyroidism  - Continue synthroid   GERD  - Protonix while inpatient (for GERD and GI proph while on high dose steroids)  Code Status: Full Disposition Plan: possible D/C home in AM if remains stable and home BIPAP arranged  Consultants: PCCM  Procedures: BIPAP  Antibiotics: Avelox 09/09/2011>>  DVT prophylaxis: lovenox  HPI/Subjective: Pt is feeling much better today.  Denies sob, f/c, n/v, or cp.  Does become dyspneic when ambulating.     Objective: Blood pressure 156/92, pulse 78, temperature 98 F (36.7 C), temperature source Oral, resp. rate 18, height 6' (1.829 m), weight 107.6 kg (237 lb 3.4 oz), SpO2 90.00%.  Intake/Output Summary (Last 24 hours) at 09/11/11 1418 Last data filed at 09/11/11 1300  Gross per 24 hour  Intake    499 ml  Output      0 ml  Net    499 ml     Exam: General: No acute respiratory distress at rest  Lungs: distant bs th/o - no focal crackles - no active wheeze  Cardiovascular: Regular rate and rhythm without murmur gallop or rub Abdomen: Nontender, nondistended, soft, bowel sounds positive, no rebound, no ascites, no appreciable mass Extremities: No significant cyanosis, clubbing, or edema bilateral lower extremities  Data Reviewed: Basic Metabolic Panel:  Lab 09/11/11 4010 09/10/11 0534 09/09/11 0630 09/08/11 2055  NA 139 138 141 143  K 4.1 4.2 4.8 4.5  CL 98 97 98 101  CO2 37*  36* 34* 39*  GLUCOSE 161* 190* 300* 121*  BUN 22 21 18 19   CREATININE 1.15 1.15 1.13 1.30  CALCIUM 8.9 9.1 9.2 9.1  MG -- 1.9 -- --  PHOS -- 4.0 -- --   Liver Function Tests:  Lab 09/09/11 0630 09/08/11 2055  AST 18 21  ALT 23 23  ALKPHOS 51 49  BILITOT 0.2* 0.3  PROT 7.2 7.2  ALBUMIN 3.8 4.1   CBC:  Lab 09/11/11 0540 09/10/11 0534 09/09/11 0630 09/08/11 2055  WBC 10.7* 10.8* 6.4 5.5  NEUTROABS -- -- 5.8 2.9  HGB 12.0* 11.9* 13.2 13.0  HCT  38.8* 38.9* 43.5 42.2  MCV 91.9 92.6 92.9 90.8  PLT 200 181 193 178   Cardiac Enzymes:  Lab 09/09/11 0635 09/08/11 2055  CKTOTAL -- --  CKMB -- --  CKMBINDEX -- --  TROPONINI <0.30 <0.30   CBG:  Lab 09/11/11 1156 09/11/11 0821 09/10/11 2149 09/10/11 1713 09/10/11 1100  GLUCAP 189* 133* 240* 228* 312*    Recent Results (from the past 240 hour(s))  MRSA PCR SCREENING     Status: Normal   Collection Time   09/09/11  6:59 AM      Component Value Range Status Comment   MRSA by PCR NEGATIVE  NEGATIVE Final      Studies:  Recent x-ray studies have been reviewed in detail by the Attending Physician  Scheduled Meds:  Reviewed in detail by the Attending Physician   Lonia Blood, MD [facility] Office  [phone] Pager [phone]  On-Call/Text Page:      [email or web address]      password [email or web address]  If 7PM-7AM, please contact night-coverage [email or web address] Password [email or web address] 09/11/2011, 2:18 PM   LOS: 3 days

## 2011-09-12 LAB — GLUCOSE, CAPILLARY
Glucose-Capillary: 228 mg/dL — ABNORMAL HIGH (ref 70–99)
Glucose-Capillary: 241 mg/dL — ABNORMAL HIGH (ref 70–99)
Glucose-Capillary: 288 mg/dL — ABNORMAL HIGH (ref 70–99)

## 2011-09-12 MED ORDER — ALBUTEROL SULFATE (5 MG/ML) 0.5% IN NEBU: 2.5000 mg | INHALATION_SOLUTION | RESPIRATORY_TRACT | Status: DC | PRN

## 2011-09-12 MED ORDER — IBUPROFEN 600 MG PO TABS: 600.0000 mg | ORAL_TABLET | Freq: Four times a day (QID) | ORAL | Status: AC | PRN

## 2011-09-12 MED ORDER — PREDNISONE 20 MG PO TABS: ORAL_TABLET | ORAL | Status: DC

## 2011-09-12 MED ORDER — MOXIFLOXACIN HCL 400 MG PO TABS: 400.0000 mg | ORAL_TABLET | Freq: Every day | ORAL | Status: AC

## 2011-09-12 MED ORDER — IPRATROPIUM BROMIDE 0.02 % IN SOLN: 0.5000 mg | Freq: Four times a day (QID) | RESPIRATORY_TRACT | Status: DC | PRN

## 2011-09-12 MED ORDER — GUAIFENESIN 200 MG PO TABS: 200.0000 mg | ORAL_TABLET | ORAL | Status: AC | PRN

## 2011-09-12 MED ORDER — LOSARTAN POTASSIUM 25 MG PO TABS: 25.0000 mg | ORAL_TABLET | Freq: Every day | ORAL | Status: DC

## 2011-09-12 MED ORDER — METFORMIN HCL 500 MG PO TABS: 500.0000 mg | ORAL_TABLET | Freq: Two times a day (BID) | ORAL | Status: DC

## 2011-09-12 MED ORDER — BUDESONIDE 0.5 MG/2ML IN SUSP: 0.5000 mg | Freq: Two times a day (BID) | RESPIRATORY_TRACT | Status: DC

## 2011-09-12 MED ORDER — MOMETASONE FURO-FORMOTEROL FUM 100-5 MCG/ACT IN AERO: 2.0000 | INHALATION_SPRAY | Freq: Two times a day (BID) | RESPIRATORY_TRACT | Status: DC

## 2011-09-12 MED ORDER — IOHEXOL 300 MG/ML  SOLN
20.0000 mL | INTRAMUSCULAR | Status: AC
  Administered 2011-09-12 (×2): 20 mL via ORAL

## 2011-09-12 NOTE — Progress Notes (Signed)
Patient ID: Richard Padilla, male   DOB: 01-Mar-1945, 66 y.o.   MRN: [id number]  [facility] PROGRESS NOTE  Richard Padilla ION:[id number] DOB: May 03, 1945 DOA: 09/08/2011 PCP: No primary provider on file.  Brief narrative:  66 year-old male with known history of sleep apnea on CPAP, hypertension, hyperlipidemia and previous history of cigarette smoking visiting the [place] area for last 3 months. During this period patient has not used his CPAP as he left it back home. Patient has been gradually getting short of breath over the last 3 weeks. Has nonproductive cough. Denied any chest pain or fever. Patient presented to the ER at [facility]. The patient was found to be wheezing and was placed on nebulizer and steroids.   Assessment/Plan:  Newly diagnosed COPD with acute exacerbation and hypercapneic respiratory failure - OSA on CPAP (noncompliant of late)  - much improved at this point  - no longer requiring daytime BIPAP  - Appreciate pulmology recommendations: "needs BIPAP full face mask, IPAP 16, EPAP 6 FiO2 35% at discharge then followed by outpatient sleep study  - have scheduled follow up with Rubye Oaks NP, 9/11 at 11:45; at that appointment he should have a sleep study scheduled and follow up with a sleep specialist ordered; also needs a PFT then"  - continue to taper prednisone over 14 day period - at discharge, start Dulera 100/5 2 puffs bid, albuterol PRN (Dulera on [facility] formulary)  - will need oxygen at home give drop in oxygen saturation down to low 80's with ambulation  Possible CAP >early RLL infiltrate on cxr  - continue Avelox  Hyperglycemia, hx of diet controlled diabetes  - A1c elevated at 7.4  - will start metformin at time of d/c  - Sliding scale insulin while intpatient  - lantus 10 units   Hypertension, blood pressure well controlled  - D/C nonselective beta blocker  - Continue ASA   Back pain  - Tylenol prn  - Ibuprofen prn   Hyperlipidemia    - statin   Hypothyroidism  - Continue synthroid   GERD  - Protonix while inpatient (for GERD and GI proph while on high dose steroids)   Code Status: Full  Disposition Plan: possible D/C home in AM if remains stable   Consultants:  PCCM   Procedures:  BIPAP   Antibiotics:  Avelox 09/09/2011>> '  DVT prophylaxis:  lovenox  HPI/Subjective: No events overnight. Pt complains of abdominal discomfort and bloating.  Objective: Filed Vitals:   09/12/11 0744 09/12/11 1420 09/12/11 1445 09/12/11 1500  BP: 118/76 172/86    Pulse: 72 87    Temp: 98.1 F (36.7 C) 98.2 F (36.8 C)    TempSrc: Oral Oral    Resp: 18 18    Height:      Weight:      SpO2: 92% 92% 90% 81%    Intake/Output Summary (Last 24 hours) at 09/12/11 1606 Last data filed at 09/12/11 1300  Gross per 24 hour  Intake    680 ml  Output      0 ml  Net    680 ml    Exam:   General:  Pt is alert, follows commands appropriately, not in acute distress  Cardiovascular: Regular rate and rhythm, S1/S2, no murmurs, no rubs, no gallops  Respiratory: bibasilar crackles, on BiPAP at this time, no accessory muscle use  Abdomen: Soft, tender in epigastric area, distended, bowel sounds present, slight guarding  Extremities: No edema, pulses DP and PT palpable  bilaterally  Neuro: Grossly nonfocal  Data Reviewed: Basic Metabolic Panel:  Lab 09/11/11 9528 09/10/11 0534 09/09/11 0630 09/08/11 2055  NA 139 138 141 143  K 4.1 4.2 4.8 4.5  CL 98 97 98 101  CO2 37* 36* 34* 39*  GLUCOSE 161* 190* 300* 121*  BUN 22 21 18 19   CREATININE 1.15 1.15 1.13 1.30  CALCIUM 8.9 9.1 9.2 9.1  MG -- 1.9 -- --  PHOS -- 4.0 -- --   Liver Function Tests:  Lab 09/09/11 0630 09/08/11 2055  AST 18 21  ALT 23 23  ALKPHOS 51 49  BILITOT 0.2* 0.3  PROT 7.2 7.2  ALBUMIN 3.8 4.1   CBC:  Lab 09/11/11 0540 09/10/11 0534 09/09/11 0630 09/08/11 2055  WBC 10.7* 10.8* 6.4 5.5  NEUTROABS -- -- 5.8 2.9  HGB 12.0* 11.9* 13.2  13.0  HCT 38.8* 38.9* 43.5 42.2  MCV 91.9 92.6 92.9 90.8  PLT 200 181 193 178   Cardiac Enzymes:  Lab 09/09/11 0635 09/08/11 2055  CKTOTAL -- --  CKMB -- --  CKMBINDEX -- --  TROPONINI <0.30 <0.30   CBG:  Lab 09/12/11 1147 09/12/11 0742 09/11/11 2304 09/11/11 1706 09/11/11 1156  GLUCAP 228* 178* 276* 264* 189*    Recent Results (from the past 240 hour(s))  MRSA PCR SCREENING     Status: Normal   Collection Time   09/09/11  6:59 AM      Component Value Range Status Comment   MRSA by PCR NEGATIVE  NEGATIVE Final      Scheduled Meds:   . albuterol  2.5 mg Nebulization Q6H  . aspirin  325 mg Oral Daily  . atorvastatin  80 mg Oral q1800  . budesonide  0.5 mg Nebulization BID  . insulin aspart  0-15 Units Subcutaneous TID WC  . insulin aspart  0-5 Units Subcutaneous QHS  . insulin aspart  3 Units Subcutaneous TID WC  . insulin glargine  10 Units Subcutaneous QHS  . ipratropium  0.5 mg Nebulization Q6H  . l-methylfolate-B6-B12  1 tablet Oral Daily  . levothyroxine  125 mcg Oral Daily  . losartan  25 mg Oral Daily  . pantoprazole  40 mg Oral Q1200  . pneumococcal 23 valent vaccine  0.5 mL Intramuscular Tomorrow-1000  . polyethylene glycol  17 g Oral Q4H while awake  . predniSONE  40 mg Oral Q breakfast   Continuous Infusions:    Debbora Presto, MD  [facility] Pager [phone]  If 7PM-7AM, please contact night-coverage [email or web address] Password [email or web address] 09/12/2011, 4:06 PM   LOS: 4 days

## 2011-09-12 NOTE — Progress Notes (Signed)
CARE MANAGEMENT NOTE 09/12/2011  Patient:  Richard Padilla, Richard Padilla   Account Number:  [id number]  Date Initiated:  09/10/2011  Documentation initiated by:  [name]  Subjective/Objective Assessment:   Pt admitted with Acute exacerbation of COPD -respiratory acidosis     Action/Plan:   PTA pt lived at home, is here staying with friend lives in [place], independent   Anticipated DC Date:  09/12/2011   Anticipated DC Plan:  HOME/SELF CARE      DC Planning Services  CM consult      Choice offered to / List presented to:  C-1 Patient   DME arranged  BIPAP      DME agency  [facility]        Status of service:  In process, will continue to follow Medicare Important Message given?   (If response is "NO", the following Medicare IM given date fields will be blank) Date Medicare IM given:   Date Additional Medicare IM given:    Discharge Disposition:    Per UR Regulation:  Reviewed for med. necessity/level of care/duration of stay  If discussed at Long Length of Stay Meetings, dates discussed:    Comments:  PCP- Dr.Sandu  (sees the [place] back in [place])  09/12/2011  1300 [name] RN, [name]  [phone] Spoke with patient, provided contact information for [employer], advised him to call Okey Regal with his credit card number for the equipment. Verified current address:  [address] [place]  [address]     [phone]  [name] (daughter)  [phone] Called with [employer] branch information in [place]  09/12/2011  [address] RN, [name]  [phone] Spoke with patient regarding the plan for discharge today and setting up the BiPAP. Provided the rental costs for the BiPAP per [employer].  BiPAP $243.76/month, O2 $186.78/month, purchase of mask = $250.00  He understood the costs and would be taking the mask used at the hospital, he was told he could do that. Explained the BiPAP would be delivered to the hospital between 5 pm and 7 pm. He verified his plans to return to [place] to the [place]  clinic.  [name]  [phone] spoke with Okey Regal. The BiPAP can be delivered at 6:00 pm.  FAX facesheet and BiPAP order to [phone] Advised her patient aware of the charges for the BiPAP. [employer]  Branch address in [place]:  [place], [place]  [name]  [phone].  [phone] or [phone]  [name] (daughter)  [phone] advised of discharge today after 5:00pm, deliver time for the BiPAP, provided the costs for the BiPAP, O2 and mask if needed.  She was planning to fly to [place] and drive her father back up to [place], but he declined. She is hoping he comes back within the month.  09/11/11- 1230- [name] RN, BSN [phone] [facility] done with [facility] both in [place] and in [place], per conversation with NP- Sue with the [facility] sleep center- pt has had a sleep study done there in 2012- and  had a CPAP arranged through the [place]- she stated that pt had not followed up with them in the last year n half and need to f/u with them to assess the need for the change from cpap to bipap by the [place] for the pt to be able to use his VA benefits to cover the cost. She also informed this CM that there was nothing that the [place] could do to assist the pt while he was here since he is not in a [place]  hospital. He would need to go to a [place] facility where they would have access to the records. Call also made to the [facility]- where there is a sleep center- was informed that pt would need to come through the emergency room since he is not an established [facility] here in this state in order for them to assist with Bipap. Had [facility] liaison check into rental of a bipap and as the pt is out of state [facility] will not be able to provide pt with bipap for rental. Call made to [employer]- and spoke with Okey Regal Emergency planning/management officer) who states that they could assist with bipap for pt and that they have agencies in [place] too that pt could return the bipap to when he returns home. Rental would have to be per month at at cost of $246.73 with a credit card. If  pt needed 02 - rental would be an extra $186.78 for the concentrator. Spoke with pt to explain all the information that was gathered both from the [place] and [place]- pt states that he understands the need to return to [place] in order to f/u with the VA and arrange long term Bipap with them in order for his insurance to cover- in the meantime- he wants to arrange bipap with [name]- and is agreeable to cost arrangements.  Call also made to pt's daughter to update her with pt's permission.  All above info shared with unit [address] CM- [name] who is to f/u with arrangement of Bipap with [facility] prior to pt's discharge.  09/10/11- 1030- [name] RN, BSN [phone] Spoke with pt at bedside regarding need for bipap- per conversation pt states that he has a cpap back home in [place]. and did not bring it with him- he states that he got his cpap through the [place] about 2 yrs ago. He reports that he goes to the [place] back home. He did not bring his CPAP because he says that it was not working properly. Explained to pt that insurance does not pay for both duplicate DME - and that he may have out of pocket expense. He may also need a sleep study to qualify for a BIPAP. Will have representative from [facility] come speak with pt regarding expense - (per [facility] - pt would have to pay about $800 out of pocket and with pt already having a CPAP through the [place]- they would not be about to provide BIPAP) Pt will need to f/u with VA regarding getting a new CPAP vs BIPAP- call placed to the VA here to see about getting information regarding what pt needs to do- feel like best option would most likely be to return to [place] and f/u with PCP there. Call placed to [facility] [phone]) awaiting return call to see if there is anything that can be done from here to facilitate getting pt a bipap while here. NCM to cont. to follow.

## 2011-09-12 NOTE — Progress Notes (Signed)
Placed pt on bipap for rest with full face mask, settings per CCMs note 16/6 with 5l o2 bleedin, pt is tolerating well at this time.  Hr 81, sats 93%.  RN notified.

## 2011-09-12 NOTE — Progress Notes (Signed)
09/12/11.1508.nsg 02 sat while ambulating 80-81%.

## 2011-09-12 NOTE — Progress Notes (Signed)
Placed pt on Bipap for rest per [name] settings 16/6 and 5L oxygen bled in. RT will continue to monitor.

## 2011-09-13 ENCOUNTER — Inpatient Hospital Stay (HOSPITAL_COMMUNITY)

## 2011-09-13 ENCOUNTER — Encounter (HOSPITAL_COMMUNITY): Payer: Self-pay | Admitting: Radiology

## 2011-09-13 DIAGNOSIS — K5900 Constipation, unspecified: Secondary | ICD-10-CM

## 2011-09-13 LAB — GLUCOSE, CAPILLARY

## 2011-09-13 LAB — CBC
HCT: 41.5 % (ref 39.0–52.0)
Hemoglobin: 13.3 g/dL (ref 13.0–17.0)
RBC: 4.69 MIL/uL (ref 4.22–5.81)

## 2011-09-13 LAB — BASIC METABOLIC PANEL
CO2: 34 mEq/L — ABNORMAL HIGH (ref 19–32)
Chloride: 96 mEq/L (ref 96–112)
Glucose, Bld: 143 mg/dL — ABNORMAL HIGH (ref 70–99)
Potassium: 3.7 mEq/L (ref 3.5–5.1)
Sodium: 139 mEq/L (ref 135–145)

## 2011-09-13 MED ORDER — IPRATROPIUM BROMIDE 0.02 % IN SOLN: 0.5000 mg | Freq: Two times a day (BID) | RESPIRATORY_TRACT | Status: DC

## 2011-09-13 MED ORDER — IOHEXOL 300 MG/ML  SOLN
100.0000 mL | Freq: Once | INTRAMUSCULAR | Status: AC | PRN
  Administered 2011-09-13: 100 mL via INTRAVENOUS

## 2011-09-13 MED ORDER — ALBUTEROL SULFATE (5 MG/ML) 0.5% IN NEBU: 2.5000 mg | INHALATION_SOLUTION | Freq: Two times a day (BID) | RESPIRATORY_TRACT | Status: DC

## 2011-09-13 NOTE — Progress Notes (Signed)
Pt. Got d/c instructions,medications were call to pharmacy by MD,IV was d/c.pt. Ready to go home.

## 2011-09-13 NOTE — Discharge Summary (Signed)
Physician Discharge Summary  Richard Padilla YTK:[id number] DOB: 05-09-1945 DOA: 09/08/2011  PCP: No primary provider on file.  Admit date: 09/08/2011 Discharge date: 09/13/2011  Recommendations for Outpatient Follow-up:  1. Pt will need to follow up with PCP in 2-3 weeks post discharge 2. Pt also has an appointment scheduled with Pulmonary specialist for re evaluation 09/18/2011 3. Please obtain BMP to evaluate electrolytes and kidney function 4. Please also check CBC to evaluate Hg and Hct levels 5. Please note that pt was sent home on oxygen due to desaturation to 85% with ambulation 6. Pt was also provided BiPAP and advised to take regularly QHS 7. Metformin was started on discharge for diabetes  Discharge Diagnoses:  Principal Problem:  *COPD with acute exacerbation Active Problems:  HTN (hypertension)  Hypothyroidism  Hyperlipidemia  OSA on CPAP  Constipation  Hyperglycemia  Acute respiratory failure with hypercapnia  Discharge Condition: Stable  Diet recommendation: Heart healthy diet discussed in details   Brief narrative:  66 year-old male with known history of sleep apnea on CPAP, hypertension, hyperlipidemia and previous history of cigarette smoking visiting the [place] area for last 3 months. During this period patient has not used his CPAP as he left it back home. Patient has been gradually getting short of breath over the last 3 weeks. Has nonproductive cough. Denied any chest pain or fever. Patient presented to the ER at [facility]. The patient was found to be wheezing and was placed on nebulizer and steroids.   Assessment/Plan:  Newly diagnosed COPD with acute exacerbation and hypercapneic respiratory failure - OSA on CPAP (noncompliant of late)  - much improved at this point  - no longer requiring daytime BIPAP  - Appreciate pulmology recommendations: "needs BIPAP full face mask, IPAP 16, EPAP 6 FiO2 35% at discharge then followed by outpatient sleep  study  - have scheduled follow up with Rubye Oaks NP, 9/11 at 11:45; at that appointment he should have a sleep study scheduled and follow up with a sleep specialist ordered; also needs a PFT  - continue to taper prednisone over 14 day period  - at discharge, start Dulera 100/5 2 puffs bid, albuterol PRN ([facility] formulary)  - will need oxygen at home give drop in oxygen saturation down to low 80's with ambulation  Possible CAP >early RLL infiltrate on cxr  - continue Avelox for 4 more days Hyperglycemia, hx of diet controlled diabetes  - A1c elevated at 7.4  - will start metformin at time of d/c  - Sliding scale insulin while intpatient  - lantus 10 units discontinued upon discharge  Hypertension, blood pressure well controlled  - D/C nonselective beta blocker  - Continue ASA  Back pain  - Tylenol prn  - Ibuprofen prn  Hyperlipidemia  - statin  Hypothyroidism  - Continue synthroid  GERD  - Protonix while inpatient (for GERD and GI proph while on high dose steroids)   Code Status: Full  Consultants:  PCCM  Procedures:  BIPAP  Antibiotics:  Avelox 09/09/2011>> 09/17/2011 DVT prophylaxis:  lovenox  Discharge Exam: Filed Vitals:   09/13/11 1000  BP: 155/99  Pulse: 78  Temp: 97.9 F (36.6 C)  Resp: 18   Filed Vitals:   09/12/11 2259 09/13/11 0553 09/13/11 0836 09/13/11 1000  BP:  145/72  155/99  Pulse: 86 93  78  Temp:  97.6 F (36.4 C)  97.9 F (36.6 C)  TempSrc:  Oral  Oral  Resp: 21 20  18  Height:      Weight:      SpO2: 94% 98% 100% 90%    General: Pt is alert, follows commands appropriately, not in acute distress Cardiovascular: Regular rate and rhythm, S1/S2 +, no murmurs, no rubs, no gallops Respiratory: Clear to auscultation bilaterally, no wheezing, no crackles, no rhonchi Abdominal: Soft, non tender, non distended, bowel sounds +, no guarding Extremities: no edema, no cyanosis, pulses palpable bilaterally DP and PT Neuro: Grossly  nonfocal  Discharge Instructions  Discharge Orders    Future Appointments: Provider: Department: Dept Phone: Center:   09/18/2011 11:45 AM Julio Sicks, NP [facility] [phone] None     Future Orders Please Complete By Expires   Diet - low sodium heart healthy      Diet - low sodium heart healthy      Increase activity slowly      Increase activity slowly        Medication List  As of 09/13/2011 12:40 PM   STOP taking these medications         propranolol 120 MG 24 hr capsule         TAKE these medications         acetaminophen 500 MG tablet   Commonly known as: TYLENOL   Take 1,000 mg by mouth every 6 (six) hours as needed. For migraine      albuterol (5 MG/ML) 0.5% nebulizer solution   Commonly known as: PROVENTIL   Take 0.5 mLs (2.5 mg total) by nebulization every 4 (four) hours as needed for wheezing or shortness of breath.      ALIVE MENS ENERGY Tabs   Take 1 tablet by mouth daily.      aspirin 325 MG tablet   Take 325 mg by mouth daily.      budesonide 0.5 MG/2ML nebulizer solution   Commonly known as: PULMICORT   Take 2 mLs (0.5 mg total) by nebulization 2 (two) times daily.      guaiFENesin 200 MG tablet   Take 1 tablet (200 mg total) by mouth every 4 (four) hours as needed for congestion.      ibuprofen 600 MG tablet   Commonly known as: ADVIL,MOTRIN   Take 1 tablet (600 mg total) by mouth every 6 (six) hours as needed (use if tylenol does not work.).      ipratropium 0.02 % nebulizer solution   Commonly known as: ATROVENT   Take 2.5 mLs (0.5 mg total) by nebulization every 6 (six) hours as needed for wheezing.      l-methylfolate-B6-B12 3-35-2 MG Tabs   Commonly known as: METANX   Take 1 tablet by mouth daily.      levothyroxine 125 MCG tablet   Commonly known as: SYNTHROID, LEVOTHROID   Take 125 mcg by mouth daily.      lisinopril 2.5 MG tablet   Commonly known as: PRINIVIL,ZESTRIL   Take 2.5 mg by mouth daily.      losartan 25  MG tablet   Commonly known as: COZAAR   Take 1 tablet (25 mg total) by mouth daily.      meloxicam 15 MG tablet   Commonly known as: MOBIC   Take 7.5 mg by mouth daily.      metFORMIN 500 MG tablet   Commonly known as: GLUCOPHAGE   Take 1 tablet (500 mg total) by mouth 2 (two) times daily with a meal.      mometasone-formoterol 100-5 MCG/ACT Aero   Commonly known  as: DULERA   Inhale 2 puffs into the lungs 2 (two) times daily.      moxifloxacin 400 MG tablet   Commonly known as: AVELOX   Take 1 tablet (400 mg total) by mouth daily.      omeprazole 20 MG capsule   Commonly known as: PRILOSEC   Take 20 mg by mouth daily.      predniSONE 20 MG tablet   Commonly known as: DELTASONE   Take 40 mg tablet daily for 3 days, taper down by 10 mg tablet every 3 days until completed, call [phone] for questions      rosuvastatin 40 MG tablet   Commonly known as: CRESTOR   Take 40 mg by mouth daily.           Follow-up Information    Follow up with PARRETT,TAMMY, NP on 09/18/2011. (11:45 AM)    Contact information:   [facility] [employer] [address] [place] [address] [phone]           The results of significant diagnostics from this hospitalization (including imaging, microbiology, ancillary and laboratory) are listed below for reference.     Microbiology: Recent Results (from the past 240 hour(s))  MRSA PCR SCREENING     Status: Normal   Collection Time   09/09/11  6:59 AM      Component Value Range Status Comment   MRSA by PCR NEGATIVE  NEGATIVE Final      Labs: Basic Metabolic Panel:  Lab 09/13/11 9147 09/11/11 0540 09/10/11 0534 09/09/11 0630 09/08/11 2055  NA 139 139 138 141 143  K 3.7 4.1 4.2 4.8 4.5  CL 96 98 97 98 101  CO2 34* 37* 36* 34* 39*  GLUCOSE 143* 161* 190* 300* 121*  BUN 17 22 21 18 19   CREATININE 1.12 1.15 1.15 1.13 1.30  CALCIUM 9.4 8.9 9.1 9.2 9.1  MG -- -- 1.9 -- --  PHOS -- -- 4.0 -- --   Liver Function  Tests:  Lab 09/09/11 0630 09/08/11 2055  AST 18 21  ALT 23 23  ALKPHOS 51 49  BILITOT 0.2* 0.3  PROT 7.2 7.2  ALBUMIN 3.8 4.1   CBC:  Lab 09/13/11 0650 09/11/11 0540 09/10/11 0534 09/09/11 0630 09/08/11 2055  WBC 11.5* 10.7* 10.8* 6.4 5.5  NEUTROABS -- -- -- 5.8 2.9  HGB 13.3 12.0* 11.9* 13.2 13.0  HCT 41.5 38.8* 38.9* 43.5 42.2  MCV 88.5 91.9 92.6 92.9 90.8  PLT 208 200 181 193 178   Cardiac Enzymes:  Lab 09/09/11 0635 09/08/11 2055  CKTOTAL -- --  CKMB -- --  CKMBINDEX -- --  TROPONINI <0.30 <0.30   BNP: BNP (last 3 results)  Basename 09/09/11 0937  PROBNP 63.8   CBG:  Lab 09/13/11 1139 09/13/11 0725 09/12/11 2149 09/12/11 1638 09/12/11 1147  GLUCAP 143* 119* 288* 241* 228*     SIGNED: Time coordinating discharge: Over 30 minutes  Debbora Presto, MD  [facility] 09/13/2011, 12:40 PM Pager [phone]  If 7PM-7AM, please contact night-coverage [email or web address] Password [email or web address]

## 2011-09-13 NOTE — Progress Notes (Signed)
Inpatient Diabetes Program Recommendations  AACE/ADA: New Consensus Statement on Inpatient Glycemic Control (2013)  Target Ranges:  Prepandial:   less than 140 mg/dL      Peak postprandial:   less than 180 mg/dL (1-2 hours)      Critically ill patients:  140 - 180 mg/dL   Post prandial elevations related to steroid therapy.    Inpatient Diabetes Program Recommendations Insulin - Meal Coverage: Increase Novolog to 5 units with meals   Thank you  Piedad Climes [facility] Inpatient Diabetes Coordinator [phone] (team pager)

## 2011-09-18 ENCOUNTER — Ambulatory Visit (INDEPENDENT_AMBULATORY_CARE_PROVIDER_SITE_OTHER): Admitting: Adult Health

## 2011-09-18 ENCOUNTER — Encounter: Payer: Self-pay | Admitting: Adult Health

## 2011-09-18 ENCOUNTER — Ambulatory Visit (INDEPENDENT_AMBULATORY_CARE_PROVIDER_SITE_OTHER)
Admission: RE | Admit: 2011-09-18 | Discharge: 2011-09-18 | Disposition: A | Discharge status: Home / Self Care | Source: Ambulatory Visit | Attending: Adult Health | Admitting: Adult Health

## 2011-09-18 VITALS — BP 122/84 | HR 86 | Temp 97.3°F | Ht 72.0 in | Wt 234.8 lb

## 2011-09-18 DIAGNOSIS — J9600 Acute respiratory failure, unspecified whether with hypoxia or hypercapnia: Secondary | ICD-10-CM

## 2011-09-18 DIAGNOSIS — J9602 Acute respiratory failure with hypercapnia: Secondary | ICD-10-CM

## 2011-09-18 DIAGNOSIS — J189 Pneumonia, unspecified organism: Secondary | ICD-10-CM

## 2011-09-18 NOTE — Patient Instructions (Addendum)
Continue on current regimen.  Continue on Dulera 2 puffs Twice daily   I will call with xray results.  BIPAP At bedtime   Avoid Lisinopril -ACE Inhibitors in future as they cause cough and wheezing.  Follow up at [facility] on return to [place]  Follow up Dr. Marchelle Gearing in 4 weeks with PFTs

## 2011-09-19 ENCOUNTER — Telehealth: Payer: Self-pay | Admitting: Internal Medicine

## 2011-09-19 MED ORDER — OMEPRAZOLE 20 MG PO CPDR: 20.0000 mg | DELAYED_RELEASE_CAPSULE | Freq: Every day | ORAL | Status: DC

## 2011-09-19 MED ORDER — FLUTICASONE PROPIONATE 50 MCG/ACT NA SUSP: 1.0000 | Freq: Every day | NASAL | Status: DC

## 2011-09-19 NOTE — Telephone Encounter (Signed)
Rx were sent to [facility] with pt and made aware

## 2011-09-19 NOTE — Telephone Encounter (Signed)
I spoke with pt and stated he forgot to ask TP if she would refill his omeprazole 20 mg and Flonase nasal spray. He stated both of these were giving to him by the [place] in [place]. flonase is not on his current medlist. He does not have a pcp. Please advise Tammy if okay to refil these 2 medications. Thanks

## 2011-09-19 NOTE — Telephone Encounter (Signed)
That is fine with 1 refill until he gets back home  flonase 1-2 puffs Twice daily  .As needed   Omeprazole same rx

## 2011-09-20 DIAGNOSIS — J189 Pneumonia, unspecified organism: Secondary | ICD-10-CM | POA: Insufficient documentation

## 2011-09-20 NOTE — Assessment & Plan Note (Addendum)
Resolved s/p hospitalization w/ COPD flare and CAP   Plan;  Continue on current regimen.  Continue on Dulera 2 puffs Twice daily   I will call with xray results.  BIPAP At bedtime   Avoid Lisinopril -ACE Inhibitors in future as they cause cough and wheezing.  Follow up at [facility] on return to [place]  Follow up Dr. Marchelle Gearing in 4 weeks with PFTs  cxr is improved but will need follow up on return in 4 weeks

## 2011-09-20 NOTE — Progress Notes (Signed)
  Subjective:    Patient ID: Richard Padilla, male    DOB: [birth year], 66 y.o.   MRN: [id number]  HPI 66 year old male with a known history of obstructive sleep apnea on nocturnal CPAP, suspected, COPD, and ongoing smoking seen for initial pulmonary consult during hospitalization. 09/08/2011 for acute hypercarbic respiratory failure  09/18/11 Post Hospital follow up  Patient was admitted 9/1 -09/13/2011 for acute hypercarbic respiratory failure, COPD, exacerbation, and decompensated, obstructive sleep apnea. Due to noncompliance of CPAP. Patient is from [place] and was visiting over last several weeks prior to admission. Increased for a, when he developed progressively worsening shortness of breath, cough, and congestion. On admission. He was found to have acute respiratory failure and hypercarbia felt due to noncompliance of his CPAP machine. He had left. This at home and had not were several weeks prior to admission Chest x-ray on showed a suspected early right lower lobe infiltrate consistent with community-acquired pneumonia. He was to do with aggressive IV antibiotics, pulmonary hygiene He was set up with a CPAP machine at discharge along with oxygen. Due to persistent desaturations He was also discharged on Avelox, which he is now finished  Since discharge. Patient says that he is feeling much improved with decreased cough and congestion and shortness of breath. He denies any hemoptysis, abdominal pain, or edema   Review of Systems Constitutional:   No  weight loss, night sweats,  Fevers, chills ++fatigue, or  lassitude.  HEENT:   No headaches,  Difficulty swallowing,  Tooth/dental problems, or  Sore throat,                No sneezing, itching, ear ache, nasal congestion, post nasal drip,   CV:  No chest pain,  Orthopnea, PND, swelling in lower extremities, anasarca, dizziness, palpitations, syncope.   GI  No heartburn, indigestion, abdominal pain, nausea, vomiting, diarrhea,  change in bowel habits, loss of appetite, bloody stools.   Resp:  No coughing up of blood.     No wheezing.  No chest wall deformity  Skin: no rash or lesions.  GU: no dysuria, change in color of urine, no urgency or frequency.  No flank pain, no hematuria   MS:  No joint pain or swelling.  No decreased range of motion.     Psych:  No change in mood or affect. No depression or anxiety.  No memory loss.        Objective:   Physical Exam GEN: A/Ox3; pleasant , NAD, obese   HEENT:  [place]/AT,  EACs-clear, TMs-wnl, NOSE-clear, THROAT-clear, no lesions, no postnasal drip or exudate noted.   NECK:  Supple w/ fair ROM; no JVD; normal carotid impulses w/o bruits; no thyromegaly or nodules palpated; no lymphadenopathy.  RESP  Coarse BS w/o, wheezes/ rales/ or rhonchi.no accessory muscle use, no dullness to percussion  CARD:  RRR, no m/r/g  , no peripheral edema, pulses intact, no cyanosis or clubbing.  GI:   Soft & nt; nml bowel sounds; no organomegaly or masses detected.  Musco: Warm bil, no deformities or joint swelling noted.   Neuro: alert, no focal deficits noted.    Skin: Warm, no lesions or rashes   CXR 09/18/11  >Improved right basilar airspace disease.        Assessment & Plan:

## 2011-09-20 NOTE — Assessment & Plan Note (Signed)
Clinically improved , improved aeration on xray  Repeat xray on return in 4 weeks

## 2012-01-13 ENCOUNTER — Other Ambulatory Visit (HOSPITAL_COMMUNITY): Payer: Self-pay | Admitting: Internal Medicine

## 2012-04-27 ENCOUNTER — Other Ambulatory Visit: Payer: Self-pay | Admitting: Adult Health

## 2015-05-11 ENCOUNTER — Emergency Department (HOSPITAL_BASED_OUTPATIENT_CLINIC_OR_DEPARTMENT_OTHER)
Admission: EM | Admit: 2015-05-11 | Discharge: 2015-05-11 | Disposition: A | Discharge status: Home / Self Care | Payer: Medicare Other | Attending: Emergency Medicine | Admitting: Emergency Medicine

## 2015-05-11 ENCOUNTER — Emergency Department (HOSPITAL_BASED_OUTPATIENT_CLINIC_OR_DEPARTMENT_OTHER): Payer: Medicare Other

## 2015-05-11 ENCOUNTER — Encounter (HOSPITAL_BASED_OUTPATIENT_CLINIC_OR_DEPARTMENT_OTHER): Payer: Self-pay | Admitting: *Deleted

## 2015-05-11 DIAGNOSIS — G43909 Migraine, unspecified, not intractable, without status migrainosus: Secondary | ICD-10-CM

## 2015-05-11 DIAGNOSIS — I10 Essential (primary) hypertension: Secondary | ICD-10-CM | POA: Insufficient documentation

## 2015-05-11 DIAGNOSIS — E079 Disorder of thyroid, unspecified: Secondary | ICD-10-CM | POA: Insufficient documentation

## 2015-05-11 DIAGNOSIS — Z794 Long term (current) use of insulin: Secondary | ICD-10-CM | POA: Insufficient documentation

## 2015-05-11 DIAGNOSIS — Z87891 Personal history of nicotine dependence: Secondary | ICD-10-CM | POA: Insufficient documentation

## 2015-05-11 DIAGNOSIS — J441 Chronic obstructive pulmonary disease with (acute) exacerbation: Secondary | ICD-10-CM | POA: Insufficient documentation

## 2015-05-11 DIAGNOSIS — L731 Pseudofolliculitis barbae: Secondary | ICD-10-CM | POA: Insufficient documentation

## 2015-05-11 LAB — CBC WITH DIFFERENTIAL/PLATELET
BASOS PCT: 1 %
Basophils Absolute: 0 10*3/uL (ref 0.0–0.1)
EOS ABS: 0.1 10*3/uL (ref 0.0–0.7)
Eosinophils Relative: 2 %
HCT: 40.5 % (ref 39.0–52.0)
HEMOGLOBIN: 13.1 g/dL (ref 13.0–17.0)
LYMPHS ABS: 2.1 10*3/uL (ref 0.7–4.0)
LYMPHS PCT: 46 %
MCH: 29.2 pg (ref 26.0–34.0)
MCHC: 32.3 g/dL (ref 30.0–36.0)
MCV: 90.2 fL (ref 78.0–100.0)
Monocytes Absolute: 0.3 10*3/uL (ref 0.1–1.0)
Monocytes Relative: 7 %
NEUTROS ABS: 1.9 10*3/uL (ref 1.7–7.7)
Neutrophils Relative %: 44 %
PLATELETS: 132 10*3/uL — AB (ref 150–400)
RBC: 4.49 MIL/uL (ref 4.22–5.81)
RDW: 13.5 % (ref 11.5–15.5)
WBC: 4.4 10*3/uL (ref 4.0–10.5)

## 2015-05-11 LAB — BASIC METABOLIC PANEL
Anion gap: 5 (ref 5–15)
BUN: 17 mg/dL (ref 6–20)
CHLORIDE: 101 mmol/L (ref 101–111)
CO2: 34 mmol/L — ABNORMAL HIGH (ref 22–32)
Calcium: 8.7 mg/dL — ABNORMAL LOW (ref 8.9–10.3)
Creatinine, Ser: 0.93 mg/dL (ref 0.61–1.24)
GFR calc Af Amer: >60 mL/min (ref >60)
GFR calc non Af Amer: >60 mL/min (ref >60)
GLUCOSE: 135 mg/dL — AB (ref 65–99)
POTASSIUM: 4.2 mmol/L (ref 3.5–5.1)
Sodium: 140 mmol/L (ref 135–145)

## 2015-05-11 LAB — TROPONIN I: Troponin I: <0.03 ng/mL (ref <0.031)

## 2015-05-11 MED ORDER — METOCLOPRAMIDE HCL 5 MG/ML IJ SOLN
10.0000 mg | Freq: Once | INTRAMUSCULAR | Status: AC
  Administered 2015-05-11: 10 mg via INTRAVENOUS
  Filled 2015-05-11: qty 2

## 2015-05-11 MED ORDER — PREDNISONE 10 MG PO TABS: 40.0000 mg | ORAL_TABLET | Freq: Every day | ORAL | Status: DC

## 2015-05-11 MED ORDER — DEXAMETHASONE SODIUM PHOSPHATE 10 MG/ML IJ SOLN
10.0000 mg | Freq: Once | INTRAMUSCULAR | Status: AC
  Administered 2015-05-11: 10 mg via INTRAVENOUS
  Filled 2015-05-11: qty 1

## 2015-05-11 MED ORDER — IPRATROPIUM-ALBUTEROL 0.5-2.5 (3) MG/3ML IN SOLN
3.0000 mL | Freq: Four times a day (QID) | RESPIRATORY_TRACT | Status: DC
  Administered 2015-05-11: 3 mL via RESPIRATORY_TRACT
  Filled 2015-05-11: qty 3

## 2015-05-11 MED ORDER — KETOROLAC TROMETHAMINE 30 MG/ML IJ SOLN
30.0000 mg | Freq: Once | INTRAMUSCULAR | Status: AC
  Administered 2015-05-11: 30 mg via INTRAVENOUS
  Filled 2015-05-11: qty 1

## 2015-05-11 MED ORDER — SODIUM CHLORIDE 0.9 % IV BOLUS (SEPSIS)
1000.0000 mL | Freq: Once | INTRAVENOUS | Status: AC
  Administered 2015-05-11: 1000 mL via INTRAVENOUS

## 2015-05-11 MED FILL — predniSONE 10 MG TABS: 10 | 5 days supply | Qty: 20 | Fill #0

## 2015-05-11 NOTE — ED Notes (Signed)
Migraine headache. Ingrown hair in his chin.

## 2015-05-11 NOTE — ED Provider Notes (Signed)
CSN: [id number]     Arrival date & time 05/11/15  1405 History   First MD Initiated Contact with Patient 05/11/15 1455     Chief Complaint  Patient presents with  . Migraine     (Consider location/radiation/quality/duration/timing/severity/associated sxs/prior Treatment) HPI 70 year old male who presents with migraine headache and multiple other complaints. He has a history of migraine headaches, hypertension, hyperlipidemia, hypothyroidism and sleep apnea. States that for the past 3 weeks he has been feeling unwell with generalized weakness and fatigue. States that for the past few days he has been having headache that is typical of his migraine headaches. Has been taking over-the-counter aspirin for this.(States that sometimes he does need to come to the hospital for migraine cocktail to make his symptoms better. Headache generalized, progressively worsening since onset a few days ago, and not associated with any vision or speech changes, focal numbness or weakness, nausea or vomiting, or neck pain/stiffness. States that he also has a history of COPD, and has been using his inhaler quite frequently feeling short of breath more often. States chest congestion with nonproductive cough in the setting of recent sick contacts at work with cold and flulike symptoms. No fevers, abdominal pain, diarrhea, or urinary complaints. No chest pain, lower extremity edema, orthopnea or PND. Patient also complains of ingrown hair over his left chin. Has been picking at it frequently to express in the ingrown hair but states that he only expresses pus. Past Medical History  Diagnosis Date  . Sleep apnea   . Hypertension   . Thyroid disease   . Hypercholesteremia   . Migraines    Past Surgical History  Procedure Laterality Date  . Back surgery    . Thyroid surgery     History reviewed. No pertinent family history. Social History  Substance Use Topics  . Smoking status: Former Smoker -- 2.00 packs/day for 20  years    Types: Cigarettes    Quit date: 09/08/1991  . Smokeless tobacco: None  . Alcohol Use: 0.0 oz/week    0 Shots of liquor per week    Review of Systems  10/14 systems reviewed and are negative other than those stated in the HPI   Allergies  Review of patient's allergies indicates no known allergies.  Home Medications   Prior to Admission medications   Medication Sig Start Date End Date Taking? Authorizing Provider  acetaminophen (TYLENOL) 500 MG tablet Take 1,000 mg by mouth every 6 (six) hours as needed. For migraine    Historical Provider, MD  albuterol (PROVENTIL) (5 MG/ML) 0.5% nebulizer solution Take 0.5 mLs (2.5 mg total) by nebulization every 4 (four) hours as needed for wheezing or shortness of breath. 09/12/11 09/11/12  Dorothea OgleIskra M Myers, MD  aspirin 325 MG tablet Take 325 mg by mouth daily.    Historical Provider, MD  budesonide (PULMICORT) 0.5 MG/2ML nebulizer solution Take 2 mLs (0.5 mg total) by nebulization 2 (two) times daily. 09/12/11 09/11/12  Dorothea OgleIskra M Myers, MD  fluticasone (FLONASE) 50 MCG/ACT nasal spray Place 1-2 sprays into the nose daily. 09/19/11 09/18/12  Tammy S Parrett, NP  ipratropium (ATROVENT) 0.02 % nebulizer solution Take 2.5 mLs (0.5 mg total) by nebulization every 6 (six) hours as needed for wheezing. 09/12/11 09/11/12  Dorothea OgleIskra M Myers, MD  l-methylfolate-B6-B12 (METANX) 3-35-2 MG TABS Take 1 tablet by mouth daily.    Historical Provider, MD  levothyroxine (SYNTHROID, LEVOTHROID) 125 MCG tablet Take 125 mcg by mouth daily.    Historical Provider, MD  lisinopril (PRINIVIL,ZESTRIL) 2.5 MG tablet Take 2.5 mg by mouth daily.    Historical Provider, MD  losartan (COZAAR) 25 MG tablet Take 1 tablet (25 mg total) by mouth daily. 09/12/11 09/11/12  Dorothea Ogle, MD  meloxicam (MOBIC) 15 MG tablet Take 7.5 mg by mouth daily.    Historical Provider, MD  metFORMIN (GLUCOPHAGE) 500 MG tablet Take 1 tablet (500 mg total) by mouth 2 (two) times daily with a meal. 09/12/11 09/11/12  Dorothea Ogle, MD  mometasone-formoterol (DULERA) 100-5 MCG/ACT AERO Inhale 2 puffs into the lungs 2 (two) times daily. 09/12/11   Dorothea Ogle, MD  Multiple Vitamins-Minerals (ALIVE MENS ENERGY) TABS Take 1 tablet by mouth daily.    Historical Provider, MD  omeprazole (PRILOSEC) 20 MG capsule TAKE ONE CAPSULE EVERY DAY 04/27/12   Tammy S Parrett, NP  predniSONE (DELTASONE) 10 MG tablet Take 4 tablets (40 mg total) by mouth daily. 05/11/15   Lavera Guise, MD  predniSONE (DELTASONE) 20 MG tablet Take 40 mg tablet daily for 3 days, taper down by 10 mg tablet every 3 days until completed, call [phone] for questions 09/12/11   Dorothea Ogle, MD  rosuvastatin (CRESTOR) 40 MG tablet Take 40 mg by mouth daily.    Historical Provider, MD   BP 188/102 mmHg  Pulse 62  Temp(Src) 97.9 F (36.6 C) (Oral)  Resp 18  Ht 6' (1.829 m)  Wt 230 lb (104.327 kg)  BMI 31.19 kg/m2  SpO2 99% Physical Exam Physical Exam  Nursing note and vitals reviewed. Constitutional: Well developed, well nourished, non-toxic, and in no acute distress Head: Normocephalic and atraumatic.  Mouth/Throat: Oropharynx is clear and moist.  Neck: Normal range of motion. Neck supple.  Cardiovascular: Normal rate and regular rhythm.   no lower extremity edema. Pulmonary/Chest: Effort normal. No conversational dyspnea. Faint expiratory wheezing throughout Abdominal: Soft. There is no tenderness. There is no rebound and no guarding.  Musculoskeletal: Normal range of motion.  Neurological: Alert, no facial droop, fluent speech, moves all extremities symmetrically, Normal gait, sensation to light touch intact throughout, pupils equal and reactive to light, extraocular movements intact. Skin: Skin is warm and dry. Small ingrown air with comedone over left chin. Psychiatric: Cooperative  ED Course  Procedures (including critical care time) Labs Review Labs Reviewed  CBC WITH DIFFERENTIAL/PLATELET - Abnormal; Notable for the following:     Platelets 132 (*)    All other components within normal limits  BASIC METABOLIC PANEL - Abnormal; Notable for the following:    CO2 34 (*)    Glucose, Bld 135 (*)    Calcium 8.7 (*)    All other components within normal limits  TROPONIN I    Imaging Review Dg Chest 2 View  05/11/2015  CLINICAL DATA:  Nonproductive cough. EXAM: CHEST  2 VIEW COMPARISON:  09/18/2011 chest radiograph. FINDINGS: Partially visualized posterior spinal fusion hardware overlying the thoracic and visualized lumbar spine. Stable cardiomediastinal silhouette with normal heart size. No pneumothorax. No pleural effusion. No pulmonary edema. No acute consolidative airspace disease. IMPRESSION: No active cardiopulmonary disease. Electronically Signed   By: Delbert Phenix M.D.   On: 05/11/2015 16:20   I have personally reviewed and evaluated these images and lab results as part of my medical decision-making.   EKG Interpretation None      MDM   Final diagnoses:  Migraine without status migrainosus, not intractable, unspecified migraine type  COPD exacerbation (HCC)  Ingrown hair    70 year old  male with history of migraine headaches, hypertension and hyperlipidemia who presents with migraine headache, shortness of breath, and ingrown hair. Well-appearing and in no acute distress. Intact neuro exam. Cardiopulmonary exam reveals wheezing suggestive of COPD exacerbation. Ingrown hair noted over chin without significant area of cellulitis or large abscess. In regards to his ingrown hair, discussed supportive care with over-the-counter benzo peroxide and salicylic acid. Headache seems typical of that of his migraine headache. No concerning features to suggest intracranial infection, bleeding, mass, thrombosis, or other serious intracranial processes. Given IV fluids, Decadron, migraine cocktail, and breathing treatments. States breathing and headache significantly improved. No need for head imaging today. His chest x-ray shows  no acute cardiopulmonary processes. He is EKG does not show evidence of acute ischemic changes. His troponin is negative. I do not feel that her shortness of breath is suggestive of atypical presentation for ACS as he is responding to COPD treatments and presentation seems more consistent with that of a flareup. Remainder basic blood work is unremarkable. I discussed continued supportive care at home regarding his headaches as well as given a course of steroids for COPD exacerbation. he will follow up within 1 week with his primary care doctor for recheck. Strict return instructions are reviewed. He expressed understanding of all discharge instructions and felt comfortable with the plan of care.    Lavera Guise, MD 05/11/15 1700

## 2015-05-11 NOTE — Discharge Instructions (Signed)
For your in-grown hair. Please use OTC benzyl peroxide or salicylic acid cream once or twice daily.  For your migraine headaches, you can take tylenol and motrin as needed. Avoid taking these medications around the clock for more than 3 days in a row, as they can cause bad rebound headaches.  For your COPD flare up. Please use your inhaler, scheduled for the next 1-2 days. Then use as needed. Take steroids as prescribed.   Chronic Obstructive Pulmonary Disease Exacerbation Chronic obstructive pulmonary disease (COPD) is a common lung problem. In COPD, the flow of air from the lungs is limited. COPD exacerbations are times that breathing gets worse and you need extra treatment. Without treatment they can be life threatening. If they happen often, your lungs can become more damaged. If your COPD gets worse, your doctor may treat you with:  Medicines.  Oxygen.  Different ways to clear your airway, such as using a mask. HOME CARE  Do not smoke.  Avoid tobacco smoke and other things that bother your lungs.  If given, take your antibiotic medicine as told. Finish the medicine even if you start to feel better.  Only take medicines as told by your doctor.  Drink enough fluids to keep your pee (urine) clear or pale yellow (unless your doctor has told you not to).  Use a cool mist machine (vaporizer).  If you use oxygen or a machine that turns liquid medicine into a mist (nebulizer), continue to use them as told.  Keep up with shots (vaccinations) as told by your doctor.  Exercise regularly.  Eat healthy foods.  Keep all doctor visits as told. GET HELP RIGHT AWAY IF:  You are very short of breath and it gets worse.  You have trouble talking.  You have bad chest pain.  You have blood in your spit (sputum).  You have a fever.  You keep throwing up (vomiting).  You feel weak, or you pass out (faint).  You feel confused.  You keep getting worse. MAKE SURE YOU:  Understand  these instructions.  Will watch your condition.  Will get help right away if you are not doing well or get worse.   This information is not intended to replace advice given to you by your health care provider. Make sure you discuss any questions you have with your health care provider.   Document Released: 12/13/2010 Document Revised: 01/14/2014 Document Reviewed: 08/28/2012 Elsevier Interactive Patient Education 2016 [employer]  Migraine Headache A migraine headache is very bad, throbbing pain on one or both sides of your head. Talk to your doctor about what things may bring on (trigger) your migraine headaches. HOME CARE  Only take medicines as told by your doctor.  Lie down in a dark, quiet room when you have a migraine.  Keep a journal to find out if certain things bring on migraine headaches. For example, write down:  What you eat and drink.  How much sleep you get.  Any change to your diet or medicines.  Lessen how much alcohol you drink.  Quit smoking if you smoke.  Get enough sleep.  Lessen any stress in your life.  Keep lights dim if bright lights bother you or make your migraines worse. GET HELP RIGHT AWAY IF:   Your migraine becomes really bad.  You have a fever.  You have a stiff neck.  You have trouble seeing.  Your muscles are weak, or you lose muscle control.  You lose your balance or have  trouble walking.  You feel like you will pass out (faint), or you pass out.  You have really bad symptoms that are different than your first symptoms. MAKE SURE YOU:   Understand these instructions.  Will watch your condition.  Will get help right away if you are not doing well or get worse.   This information is not intended to replace advice given to you by your health care provider. Make sure you discuss any questions you have with your health care provider.   Document Released: 10/03/2007 Document Revised: 03/18/2011 Document Reviewed:  08/31/2012 Elsevier Interactive Patient Education [employer]

## 2016-06-22 ENCOUNTER — Encounter (HOSPITAL_BASED_OUTPATIENT_CLINIC_OR_DEPARTMENT_OTHER): Payer: Self-pay | Admitting: *Deleted

## 2016-06-22 ENCOUNTER — Emergency Department (HOSPITAL_BASED_OUTPATIENT_CLINIC_OR_DEPARTMENT_OTHER)
Admission: EM | Admit: 2016-06-22 | Discharge: 2016-06-22 | Disposition: A | Discharge status: Home / Self Care | Payer: Medicare Other | Attending: Emergency Medicine | Admitting: Emergency Medicine

## 2016-06-22 ENCOUNTER — Emergency Department (HOSPITAL_BASED_OUTPATIENT_CLINIC_OR_DEPARTMENT_OTHER): Payer: Medicare Other

## 2016-06-22 DIAGNOSIS — E039 Hypothyroidism, unspecified: Secondary | ICD-10-CM | POA: Diagnosis not present

## 2016-06-22 DIAGNOSIS — J441 Chronic obstructive pulmonary disease with (acute) exacerbation: Secondary | ICD-10-CM | POA: Diagnosis not present

## 2016-06-22 DIAGNOSIS — Z7982 Long term (current) use of aspirin: Secondary | ICD-10-CM | POA: Diagnosis not present

## 2016-06-22 DIAGNOSIS — I10 Essential (primary) hypertension: Secondary | ICD-10-CM | POA: Diagnosis not present

## 2016-06-22 DIAGNOSIS — Z87891 Personal history of nicotine dependence: Secondary | ICD-10-CM | POA: Insufficient documentation

## 2016-06-22 DIAGNOSIS — Z7984 Long term (current) use of oral hypoglycemic drugs: Secondary | ICD-10-CM | POA: Insufficient documentation

## 2016-06-22 DIAGNOSIS — Z79899 Other long term (current) drug therapy: Secondary | ICD-10-CM | POA: Diagnosis not present

## 2016-06-22 DIAGNOSIS — Z7951 Long term (current) use of inhaled steroids: Secondary | ICD-10-CM | POA: Insufficient documentation

## 2016-06-22 DIAGNOSIS — R0602 Shortness of breath: Secondary | ICD-10-CM | POA: Diagnosis present

## 2016-06-22 LAB — CBC WITH DIFFERENTIAL/PLATELET
Basophils Absolute: 0 10*3/uL (ref 0.0–0.1)
Basophils Relative: 0 %
EOS ABS: 0.1 10*3/uL (ref 0.0–0.7)
EOS PCT: 2 %
HCT: 38.3 % — ABNORMAL LOW (ref 39.0–52.0)
Hemoglobin: 12.5 g/dL — ABNORMAL LOW (ref 13.0–17.0)
LYMPHS ABS: 1.7 10*3/uL (ref 0.7–4.0)
Lymphocytes Relative: 31 %
MCH: 29 pg (ref 26.0–34.0)
MCHC: 32.6 g/dL (ref 30.0–36.0)
MCV: 88.9 fL (ref 78.0–100.0)
MONO ABS: 0.6 10*3/uL (ref 0.1–1.0)
MONOS PCT: 12 %
Neutro Abs: 3 10*3/uL (ref 1.7–7.7)
Neutrophils Relative %: 55 %
PLATELETS: 190 10*3/uL (ref 150–400)
RBC: 4.31 MIL/uL (ref 4.22–5.81)
RDW: 14 % (ref 11.5–15.5)
WBC: 5.5 10*3/uL (ref 4.0–10.5)

## 2016-06-22 LAB — BASIC METABOLIC PANEL
Anion gap: 8 (ref 5–15)
BUN: 23 mg/dL — AB (ref 6–20)
CHLORIDE: 98 mmol/L — AB (ref 101–111)
CO2: 35 mmol/L — AB (ref 22–32)
CREATININE: 1.18 mg/dL (ref 0.61–1.24)
Calcium: 8.9 mg/dL (ref 8.9–10.3)
GFR calc Af Amer: >60 mL/min (ref >60)
GFR calc non Af Amer: >60 mL/min (ref >60)
Glucose, Bld: 137 mg/dL — ABNORMAL HIGH (ref 65–99)
Potassium: 4.2 mmol/L (ref 3.5–5.1)
Sodium: 141 mmol/L (ref 135–145)

## 2016-06-22 LAB — TROPONIN I

## 2016-06-22 MED ORDER — PREDNISONE 10 MG PO TABS: 40.0000 mg | ORAL_TABLET | Freq: Every day | ORAL | 0 refills | Status: DC

## 2016-06-22 MED ORDER — ALBUTEROL SULFATE (2.5 MG/3ML) 0.083% IN NEBU
INHALATION_SOLUTION | RESPIRATORY_TRACT | Status: AC
  Administered 2016-06-22: 2.5 mg
  Filled 2016-06-22: qty 3

## 2016-06-22 MED ORDER — BENZONATATE 100 MG PO CAPS: 100.0000 mg | ORAL_CAPSULE | Freq: Three times a day (TID) | ORAL | 0 refills | Status: DC | PRN

## 2016-06-22 MED ORDER — IPRATROPIUM-ALBUTEROL 0.5-2.5 (3) MG/3ML IN SOLN
RESPIRATORY_TRACT | Status: AC
  Administered 2016-06-22: 3 mL
  Filled 2016-06-22: qty 3

## 2016-06-22 MED ORDER — ALBUTEROL SULFATE HFA 108 (90 BASE) MCG/ACT IN AERS
2.0000 | INHALATION_SPRAY | RESPIRATORY_TRACT | Status: DC | PRN
  Administered 2016-06-22: 2 via RESPIRATORY_TRACT
  Filled 2016-06-22: qty 6.7

## 2016-06-22 MED ORDER — PREDNISONE 20 MG PO TABS
40.0000 mg | ORAL_TABLET | Freq: Once | ORAL | Status: AC
  Administered 2016-06-22: 40 mg via ORAL
  Filled 2016-06-22: qty 2

## 2016-06-22 NOTE — Discharge Instructions (Signed)
Use your albuterol inhaler, 2 puffs every four hours as needed for cough/shortness of breath.

## 2016-06-22 NOTE — ED Notes (Signed)
ED Provider at bedside. 

## 2016-06-22 NOTE — ED Triage Notes (Signed)
Pt reports wheezing, cough and congestion for several weeks; reports home treatments aren't working (pt able to speak in complete sentences). Denies chest pain, fever, n/v/d. Also reports nasal congestion; denies sore throat.

## 2016-06-22 NOTE — ED Provider Notes (Signed)
[facility]   CSN: [id number] Arrival date & time: 06/22/16  0736     History   Chief Complaint Chief Complaint  Patient presents with  . Cough    HPI Richard Padilla is a 71 y.o. male.  The history is provided by the patient. No language interpreter was used.  Cough    Richard Padilla is a 71 y.o. male who presents to the Emergency Department complaining of cough/sob.  He presents for evaluation of 3 months of progressive shortness of breath with cough. Over the last several weeks he reports increased shortness of breath and dyspnea on exertion with heavy activity. He also endorses paroxysmal nocturnal dyspnea that is intermittent in nature. He has a cough that is nonproductive over the last several weeks. He denies any fevers, chest pain, abdominal pain, leg swelling or pain. He has experienced similar symptoms in the past. Past Medical History:  Diagnosis Date  . Hypercholesteremia   . Hypertension   . Migraines   . Sleep apnea   . Thyroid disease     Patient Active Problem List   Diagnosis Date Noted  . CAP (community acquired pneumonia) 09/20/2011  . COPD with acute exacerbation (HCC) 09/09/2011  . HTN (hypertension) 09/09/2011  . Hypothyroidism 09/09/2011  . Hyperlipidemia 09/09/2011  . OSA on CPAP 09/09/2011  . Constipation 09/09/2011  . Hyperglycemia 09/09/2011  . Acute respiratory failure with hypercapnia (HCC) 09/09/2011    Past Surgical History:  Procedure Laterality Date  . BACK SURGERY    . THYROID SURGERY         Home Medications    Prior to Admission medications   Medication Sig Start Date End Date Taking? Authorizing Provider  acetaminophen (TYLENOL) 500 MG tablet Take 500 mg by mouth daily. For migraine    Yes [provider]  albuterol (PROVENTIL) (5 MG/ML) 0.5% nebulizer solution Take 0.5 mLs (2.5 mg total) by nebulization every 4 (four) hours as needed for wheezing or shortness of breath. 09/12/11  06/22/16 Yes Dorothea Ogle, MD  budesonide (PULMICORT) 0.5 MG/2ML nebulizer solution Take 2 mLs (0.5 mg total) by nebulization 2 (two) times daily. 09/12/11 06/22/16 Yes Dorothea Ogle, MD  fluticasone [facility]) 50 MCG/ACT nasal spray Place 1-2 sprays into the nose daily. 09/19/11 06/22/16 Yes Parrett, Tammy S, NP  hydrALAZINE (APRESOLINE) 25 MG tablet Take 25 mg by mouth 3 (three) times daily.   Yes [provider]  ipratropium (ATROVENT) 0.02 % nebulizer solution Take 2.5 mLs (0.5 mg total) by nebulization every 6 (six) hours as needed for wheezing. 09/12/11 06/22/16 Yes Dorothea Ogle, MD  levothyroxine (SYNTHROID, LEVOTHROID) 200 MCG tablet Take 200 mcg by mouth daily before breakfast.   Yes [provider]  loratadine (CLARITIN) 10 MG tablet Take 10 mg by mouth daily.   Yes [provider]  losartan (COZAAR) 25 MG tablet Take 1 tablet (25 mg total) by mouth daily. 09/12/11 06/22/16 Yes Dorothea Ogle, MD  metFORMIN (GLUCOPHAGE) 500 MG tablet Take 1 tablet (500 mg total) by mouth 2 (two) times daily with a meal. 09/12/11 06/22/16 Yes Dorothea Ogle, MD  Multiple Vitamins-Minerals (ALIVE MENS ENERGY) TABS Take 1 tablet by mouth daily.   Yes [provider]  omeprazole (PRILOSEC) 20 MG capsule TAKE ONE CAPSULE EVERY DAY 04/27/12  Yes Parrett, Tammy S, NP  propranolol ER (INDERAL LA) 120 MG 24 hr capsule Take 120 mg by mouth daily.   Yes [provider]  rosuvastatin (CRESTOR)  40 MG tablet Take 40 mg by mouth daily.   Yes [provider]  aspirin 325 MG tablet Take 325 mg by mouth daily.    [provider]  benzonatate (TESSALON) 100 MG capsule Take 1 capsule (100 mg total) by mouth 3 (three) times daily as needed for cough. 06/22/16   Tilden Fossaees, Eliya Bubar, MD  l-methylfolate-B6-B12 [facility](METANX) 3-35-2 MG TABS Take 1 tablet by mouth daily.    [provider]  levothyroxine (SYNTHROID, LEVOTHROID) 125 MCG tablet Take 125 mcg by mouth daily.    [provider]  lisinopril (PRINIVIL,ZESTRIL) 2.5 MG tablet Take 2.5 mg by mouth daily.    [provider]  meloxicam (MOBIC) 15 MG tablet Take 7.5 mg by mouth daily.    [provider]  mometasone-formoterol (DULERA) 100-5 MCG/ACT AERO Inhale 2 puffs into the lungs 2 (two) times daily. 09/12/11   Dorothea OgleMyers, Iskra M, MD  predniSONE (DELTASONE) 10 MG tablet Take 4 tablets (40 mg total) by mouth daily. 06/22/16   Tilden Fossaees, Danny Zimny, MD    Family History No family history on file.  Social History Social History  Substance Use Topics  . Smoking status: Former Smoker    Packs/day: 2.00    Years: 20.00    Types: Cigarettes    Quit date: 09/08/1991  . Smokeless tobacco: Never Used  . Alcohol use 0.0 oz/week     Comment: occ     Allergies   Patient has no known allergies.   Review of Systems Review of Systems  Respiratory: Positive for cough.   All other systems reviewed and are negative.    Physical Exam Updated Vital Signs BP 137/83 (BP Location: Left Arm)   Pulse 92   Temp 98 F (36.7 C) (Oral)   Resp 18   Ht 6' (1.829 m)   Wt 102.1 kg (225 lb)   SpO2 96%   BMI 30.52 kg/m   Physical Exam  Constitutional: He is oriented to person, place, and time. He appears well-developed and well-nourished.  HENT:  Head: Normocephalic and atraumatic.  Cardiovascular: Normal rate and regular rhythm.   No murmur heard. Pulmonary/Chest: Effort normal. No respiratory distress.  Decreased air movement in bilateral bases with occasional end expiratory wheezes in the upper lung fields  Abdominal: Soft. There is no tenderness. There is no rebound and no guarding.  Musculoskeletal: He exhibits no edema or tenderness.  Neurological: He is alert and oriented to person, place, and time.  Skin: Skin is warm and dry.  Psychiatric: He has a normal mood and affect. His behavior is normal.  Nursing note and vitals reviewed.    ED Treatments / Results  Labs (all labs ordered are  listed, but only abnormal results are displayed) Labs Reviewed  BASIC METABOLIC PANEL - Abnormal; Notable for the following:       Result Value   Chloride 98 (*)    CO2 35 (*)    Glucose, Bld 137 (*)    BUN 23 (*)    All other components within normal limits  CBC WITH DIFFERENTIAL/PLATELET - Abnormal; Notable for the following:    Hemoglobin 12.5 (*)    HCT 38.3 (*)    All other components within normal limits  TROPONIN I    EKG  EKG Interpretation  Date/Time:  Saturday June 22 2016 08:33:44 EDT Ventricular Rate:  57 PR Interval:    QRS Duration: 95 QT Interval:  455 QTC Calculation: 443 R Axis:   1 Text Interpretation:  Sinus rhythm Borderline ST elevation in inferolateral leads c/w early repolarization.   No significant change since last tracing Confirmed by Lincoln Brigham [phone]) on 06/22/2016 8:44:30 AM Also confirmed by Lincoln Brigham [phone]), editor Misty Stanley [phone])  on 06/22/2016 9:23:19 AM       Radiology Dg Chest 2 View  Result Date: 06/22/2016 CLINICAL DATA:  Cough and congestion.  Wheezing. EXAM: CHEST  2 VIEW COMPARISON:  05/11/2015 FINDINGS: The heart size and mediastinal contours are within normal limits. Both lungs are clear. Chronic appearing right posterior rib deformities. Unchanged. Rightward curvature of the thoracic spine. Scoliosis rods are identified. IMPRESSION: 1. No acute cardiopulmonary abnormalities. Electronically Signed   By: Signa Kell M.D.   On: 06/22/2016 09:12    Procedures Procedures (including critical care time)  Medications Ordered in ED Medications  albuterol (PROVENTIL HFA;VENTOLIN HFA) 108 (90 Base) MCG/ACT inhaler 2 puff (2 puffs Inhalation Given 06/22/16 0943)  albuterol (PROVENTIL) (2.5 MG/3ML) 0.083% nebulizer solution (2.5 mg  Given 06/22/16 0814)  ipratropium-albuterol (DUONEB) 0.5-2.5 (3) MG/3ML nebulizer solution (3 mLs  Given 06/22/16 0814)  predniSONE (DELTASONE) tablet 40 mg (40 mg Oral Given 06/22/16 0830)      Initial Impression / Assessment and Plan / ED Course  I have reviewed the triage vital signs and the nursing notes.  Pertinent labs & imaging results that were available during my care of the patient were reviewed by me and considered in my medical decision making (see chart for details).     Patient here for evaluation of progressive shortness of breath, dyspnea on exertion and paroxysmal nocturnal dyspnea. Presentation is not consistent with ACS, PE, CHF. On treatment with albuterol in the department his breathing has become much more comfortable. Discussed with patient home care for COPD exacerbation. Discussed close outpatient follow-up and return precautions.  Final Clinical Impressions(s) / ED Diagnoses   Final diagnoses:  COPD with acute exacerbation (HCC)    New Prescriptions New Prescriptions   BENZONATATE (TESSALON) 100 MG CAPSULE    Take 1 capsule (100 mg total) by mouth 3 (three) times daily as needed for cough.   PREDNISONE (DELTASONE) 10 MG TABLET    Take 4 tablets (40 mg total) by mouth daily.     Tilden Fossa, MD 06/22/16 [id number]

## 2016-06-26 ENCOUNTER — Inpatient Hospital Stay (HOSPITAL_BASED_OUTPATIENT_CLINIC_OR_DEPARTMENT_OTHER)
Admission: EM | Admit: 2016-06-26 | Discharge: 2016-06-29 | DRG: 190 | Disposition: A | Discharge status: Home / Self Care | Payer: Medicare Other | Attending: Internal Medicine | Admitting: Internal Medicine

## 2016-06-26 ENCOUNTER — Encounter (HOSPITAL_BASED_OUTPATIENT_CLINIC_OR_DEPARTMENT_OTHER): Payer: Self-pay

## 2016-06-26 ENCOUNTER — Emergency Department (HOSPITAL_BASED_OUTPATIENT_CLINIC_OR_DEPARTMENT_OTHER): Payer: Medicare Other

## 2016-06-26 DIAGNOSIS — J441 Chronic obstructive pulmonary disease with (acute) exacerbation: Secondary | ICD-10-CM | POA: Diagnosis present

## 2016-06-26 DIAGNOSIS — Z87891 Personal history of nicotine dependence: Secondary | ICD-10-CM | POA: Diagnosis not present

## 2016-06-26 DIAGNOSIS — E039 Hypothyroidism, unspecified: Secondary | ICD-10-CM | POA: Diagnosis not present

## 2016-06-26 DIAGNOSIS — G43909 Migraine, unspecified, not intractable, without status migrainosus: Secondary | ICD-10-CM | POA: Diagnosis present

## 2016-06-26 DIAGNOSIS — E785 Hyperlipidemia, unspecified: Secondary | ICD-10-CM | POA: Diagnosis not present

## 2016-06-26 DIAGNOSIS — Z7951 Long term (current) use of inhaled steroids: Secondary | ICD-10-CM

## 2016-06-26 DIAGNOSIS — Z791 Long term (current) use of non-steroidal anti-inflammatories (NSAID): Secondary | ICD-10-CM | POA: Diagnosis not present

## 2016-06-26 DIAGNOSIS — Z6831 Body mass index (BMI) 31.0-31.9, adult: Secondary | ICD-10-CM | POA: Diagnosis not present

## 2016-06-26 DIAGNOSIS — R001 Bradycardia, unspecified: Secondary | ICD-10-CM | POA: Diagnosis present

## 2016-06-26 DIAGNOSIS — E1165 Type 2 diabetes mellitus with hyperglycemia: Secondary | ICD-10-CM | POA: Diagnosis present

## 2016-06-26 DIAGNOSIS — Z9989 Dependence on other enabling machines and devices: Secondary | ICD-10-CM | POA: Diagnosis not present

## 2016-06-26 DIAGNOSIS — J9601 Acute respiratory failure with hypoxia: Secondary | ICD-10-CM | POA: Diagnosis not present

## 2016-06-26 DIAGNOSIS — M7989 Other specified soft tissue disorders: Secondary | ICD-10-CM | POA: Diagnosis present

## 2016-06-26 DIAGNOSIS — J449 Chronic obstructive pulmonary disease, unspecified: Secondary | ICD-10-CM | POA: Diagnosis not present

## 2016-06-26 DIAGNOSIS — Z7982 Long term (current) use of aspirin: Secondary | ICD-10-CM | POA: Diagnosis not present

## 2016-06-26 DIAGNOSIS — G4733 Obstructive sleep apnea (adult) (pediatric): Secondary | ICD-10-CM | POA: Diagnosis not present

## 2016-06-26 DIAGNOSIS — I10 Essential (primary) hypertension: Secondary | ICD-10-CM | POA: Diagnosis present

## 2016-06-26 DIAGNOSIS — R0602 Shortness of breath: Secondary | ICD-10-CM | POA: Diagnosis present

## 2016-06-26 DIAGNOSIS — E7800 Pure hypercholesterolemia, unspecified: Secondary | ICD-10-CM | POA: Diagnosis present

## 2016-06-26 DIAGNOSIS — R739 Hyperglycemia, unspecified: Secondary | ICD-10-CM | POA: Diagnosis present

## 2016-06-26 DIAGNOSIS — K5900 Constipation, unspecified: Secondary | ICD-10-CM | POA: Diagnosis not present

## 2016-06-26 DIAGNOSIS — I5030 Unspecified diastolic (congestive) heart failure: Secondary | ICD-10-CM | POA: Diagnosis not present

## 2016-06-26 LAB — CBC WITH DIFFERENTIAL/PLATELET
Basophils Absolute: 0 10*3/uL (ref 0.0–0.1)
Basophils Relative: 0 %
Eosinophils Absolute: 0 10*3/uL (ref 0.0–0.7)
Eosinophils Relative: 0 %
HEMATOCRIT: 39.6 % (ref 39.0–52.0)
Hemoglobin: 12.9 g/dL — ABNORMAL LOW (ref 13.0–17.0)
LYMPHS ABS: 1.2 10*3/uL (ref 0.7–4.0)
LYMPHS PCT: 19 %
MCH: 29 pg (ref 26.0–34.0)
MCHC: 32.6 g/dL (ref 30.0–36.0)
MCV: 89 fL (ref 78.0–100.0)
Monocytes Absolute: 0.8 10*3/uL (ref 0.1–1.0)
Monocytes Relative: 13 %
NEUTROS ABS: 4.4 10*3/uL (ref 1.7–7.7)
Neutrophils Relative %: 68 %
Platelets: 188 10*3/uL (ref 150–400)
RBC: 4.45 MIL/uL (ref 4.22–5.81)
RDW: 14.2 % (ref 11.5–15.5)
WBC: 6.4 10*3/uL (ref 4.0–10.5)

## 2016-06-26 LAB — CBC
HEMATOCRIT: 40.3 % (ref 39.0–52.0)
Hemoglobin: 13.3 g/dL (ref 13.0–17.0)
MCH: 28.8 pg (ref 26.0–34.0)
MCHC: 33 g/dL (ref 30.0–36.0)
MCV: 87.2 fL (ref 78.0–100.0)
Platelets: 216 10*3/uL (ref 150–400)
RBC: 4.62 MIL/uL (ref 4.22–5.81)
RDW: 14.4 % (ref 11.5–15.5)
WBC: 8.2 10*3/uL (ref 4.0–10.5)

## 2016-06-26 LAB — BASIC METABOLIC PANEL
Anion gap: 5 (ref 5–15)
BUN: 20 mg/dL (ref 6–20)
CHLORIDE: 100 mmol/L — AB (ref 101–111)
CO2: 34 mmol/L — AB (ref 22–32)
Calcium: 8.7 mg/dL — ABNORMAL LOW (ref 8.9–10.3)
Creatinine, Ser: 1.07 mg/dL (ref 0.61–1.24)
GFR calc Af Amer: >60 mL/min (ref >60)
GFR calc non Af Amer: >60 mL/min (ref >60)
Glucose, Bld: 183 mg/dL — ABNORMAL HIGH (ref 65–99)
POTASSIUM: 3.7 mmol/L (ref 3.5–5.1)
SODIUM: 139 mmol/L (ref 135–145)

## 2016-06-26 LAB — TSH: TSH: 0.021 u[IU]/mL — ABNORMAL LOW (ref 0.350–4.500)

## 2016-06-26 LAB — CREATININE, SERUM
CREATININE: 1.01 mg/dL (ref 0.61–1.24)
GFR calc Af Amer: >60 mL/min (ref >60)

## 2016-06-26 LAB — BRAIN NATRIURETIC PEPTIDE: B Natriuretic Peptide: 96.6 pg/mL (ref 0.0–100.0)

## 2016-06-26 LAB — GLUCOSE, CAPILLARY
GLUCOSE-CAPILLARY: 320 mg/dL — AB (ref 65–99)
Glucose-Capillary: 239 mg/dL — ABNORMAL HIGH (ref 65–99)

## 2016-06-26 LAB — TROPONIN I

## 2016-06-26 MED ORDER — ACETAMINOPHEN 325 MG PO TABS
650.0000 mg | ORAL_TABLET | Freq: Four times a day (QID) | ORAL | Status: DC | PRN
  Administered 2016-06-26 – 2016-06-28 (×4): 650 mg via ORAL
  Filled 2016-06-26 (×4): qty 2

## 2016-06-26 MED ORDER — ONDANSETRON HCL 4 MG PO TABS: 4.0000 mg | ORAL_TABLET | Freq: Four times a day (QID) | ORAL | Status: DC | PRN

## 2016-06-26 MED ORDER — PANTOPRAZOLE SODIUM 40 MG PO TBEC
40.0000 mg | DELAYED_RELEASE_TABLET | Freq: Every day | ORAL | Status: DC
  Administered 2016-06-27 – 2016-06-29 (×3): 40 mg via ORAL
  Filled 2016-06-26 (×3): qty 1

## 2016-06-26 MED ORDER — INSULIN ASPART 100 UNIT/ML ~~LOC~~ SOLN: 0.0000 [IU] | Freq: Three times a day (TID) | SUBCUTANEOUS | Status: DC

## 2016-06-26 MED ORDER — INSULIN ASPART 100 UNIT/ML ~~LOC~~ SOLN
0.0000 [IU] | Freq: Three times a day (TID) | SUBCUTANEOUS | Status: DC
  Administered 2016-06-27: 11 [IU] via SUBCUTANEOUS
  Administered 2016-06-27 (×2): 5 [IU] via SUBCUTANEOUS
  Administered 2016-06-28: 11 [IU] via SUBCUTANEOUS
  Administered 2016-06-28: 5 [IU] via SUBCUTANEOUS
  Administered 2016-06-28: 8 [IU] via SUBCUTANEOUS
  Administered 2016-06-29: 3 [IU] via SUBCUTANEOUS
  Administered 2016-06-29: 5 [IU] via SUBCUTANEOUS

## 2016-06-26 MED ORDER — LEVOTHYROXINE SODIUM 125 MCG PO TABS
125.0000 ug | ORAL_TABLET | Freq: Every day | ORAL | Status: DC
  Administered 2016-06-27 – 2016-06-29 (×3): 125 ug via ORAL
  Filled 2016-06-26 (×3): qty 1

## 2016-06-26 MED ORDER — DEXTROSE 5 % IV SOLN
1.0000 g | Freq: Once | INTRAVENOUS | Status: AC
  Administered 2016-06-26: 1 g via INTRAVENOUS
  Filled 2016-06-26: qty 10

## 2016-06-26 MED ORDER — MAGNESIUM SULFATE 2 GM/50ML IV SOLN
2.0000 g | Freq: Once | INTRAVENOUS | Status: AC
Start: 2016-06-26 — End: 2016-06-26
  Administered 2016-06-26: 2 g via INTRAVENOUS

## 2016-06-26 MED ORDER — IPRATROPIUM-ALBUTEROL 0.5-2.5 (3) MG/3ML IN SOLN
3.0000 mL | Freq: Four times a day (QID) | RESPIRATORY_TRACT | Status: DC
  Administered 2016-06-26: 3 mL via RESPIRATORY_TRACT
  Filled 2016-06-26: qty 3

## 2016-06-26 MED ORDER — LISINOPRIL 5 MG PO TABS: 2.5000 mg | ORAL_TABLET | Freq: Every day | ORAL | Status: DC

## 2016-06-26 MED ORDER — SENNOSIDES-DOCUSATE SODIUM 8.6-50 MG PO TABS
1.0000 | ORAL_TABLET | Freq: Every evening | ORAL | Status: DC | PRN
  Administered 2016-06-27: 1 via ORAL
  Filled 2016-06-26: qty 1

## 2016-06-26 MED ORDER — SODIUM CHLORIDE 0.9 % IV SOLN
INTRAVENOUS | Status: DC
  Administered 2016-06-26 – 2016-06-27 (×2): via INTRAVENOUS

## 2016-06-26 MED ORDER — ONDANSETRON HCL 4 MG/2ML IJ SOLN: 4.0000 mg | Freq: Four times a day (QID) | INTRAMUSCULAR | Status: DC | PRN

## 2016-06-26 MED ORDER — HYDRALAZINE HCL 25 MG PO TABS
25.0000 mg | ORAL_TABLET | Freq: Three times a day (TID) | ORAL | Status: DC
  Administered 2016-06-26 – 2016-06-29 (×8): 25 mg via ORAL
  Filled 2016-06-26 (×8): qty 1

## 2016-06-26 MED ORDER — ALBUTEROL SULFATE (2.5 MG/3ML) 0.083% IN NEBU: 2.5000 mg | INHALATION_SOLUTION | RESPIRATORY_TRACT | Status: DC | PRN

## 2016-06-26 MED ORDER — AZITHROMYCIN 500 MG IV SOLR: 500.0000 mg | INTRAVENOUS | Status: DC

## 2016-06-26 MED ORDER — MOMETASONE FURO-FORMOTEROL FUM 100-5 MCG/ACT IN AERO
2.0000 | INHALATION_SPRAY | Freq: Two times a day (BID) | RESPIRATORY_TRACT | Status: DC
  Administered 2016-06-26 – 2016-06-29 (×6): 2 via RESPIRATORY_TRACT
  Filled 2016-06-26: qty 8.8

## 2016-06-26 MED ORDER — ACETAMINOPHEN 650 MG RE SUPP: 650.0000 mg | Freq: Four times a day (QID) | RECTAL | Status: DC | PRN

## 2016-06-26 MED ORDER — GUAIFENESIN ER 600 MG PO TB12
600.0000 mg | ORAL_TABLET | Freq: Two times a day (BID) | ORAL | Status: DC
Start: 2016-06-26 — End: 2016-06-29
  Administered 2016-06-26 – 2016-06-29 (×6): 600 mg via ORAL
  Filled 2016-06-26 (×7): qty 1

## 2016-06-26 MED ORDER — ROSUVASTATIN CALCIUM 10 MG PO TABS
40.0000 mg | ORAL_TABLET | Freq: Every day | ORAL | Status: DC
  Administered 2016-06-26 – 2016-06-28 (×3): 40 mg via ORAL
  Filled 2016-06-26 (×3): qty 4

## 2016-06-26 MED ORDER — PROPRANOLOL HCL ER 120 MG PO CP24
120.0000 mg | ORAL_CAPSULE | Freq: Every day | ORAL | Status: DC
  Filled 2016-06-26 (×2): qty 1

## 2016-06-26 MED ORDER — IPRATROPIUM-ALBUTEROL 0.5-2.5 (3) MG/3ML IN SOLN
3.0000 mL | Freq: Once | RESPIRATORY_TRACT | Status: AC
  Administered 2016-06-26: 3 mL via RESPIRATORY_TRACT
  Filled 2016-06-26: qty 3

## 2016-06-26 MED ORDER — ALBUTEROL (5 MG/ML) CONTINUOUS INHALATION SOLN
10.0000 mg/h | INHALATION_SOLUTION | RESPIRATORY_TRACT | Status: DC
  Administered 2016-06-26: 10 mg/h via RESPIRATORY_TRACT
  Filled 2016-06-26: qty 20

## 2016-06-26 MED ORDER — SODIUM CHLORIDE 0.9% FLUSH
3.0000 mL | Freq: Two times a day (BID) | INTRAVENOUS | Status: DC
  Administered 2016-06-27 – 2016-06-29 (×3): 3 mL via INTRAVENOUS

## 2016-06-26 MED ORDER — HYDRALAZINE HCL 20 MG/ML IJ SOLN
10.0000 mg | Freq: Four times a day (QID) | INTRAMUSCULAR | Status: DC | PRN
  Filled 2016-06-26: qty 0.5

## 2016-06-26 MED ORDER — ENOXAPARIN SODIUM 60 MG/0.6ML ~~LOC~~ SOLN
50.0000 mg | SUBCUTANEOUS | Status: DC
  Administered 2016-06-26 – 2016-06-28 (×3): 50 mg via SUBCUTANEOUS
  Filled 2016-06-26 (×3): qty 0.6

## 2016-06-26 MED ORDER — AZITHROMYCIN 500 MG IV SOLR
INTRAVENOUS | Status: AC
  Filled 2016-06-26: qty 500

## 2016-06-26 MED ORDER — FLUTICASONE PROPIONATE 50 MCG/ACT NA SUSP
1.0000 | Freq: Every day | NASAL | Status: DC
  Administered 2016-06-26 – 2016-06-29 (×4): 1 via NASAL
  Filled 2016-06-26: qty 16

## 2016-06-26 MED ORDER — MAGNESIUM SULFATE 2 GM/50ML IV SOLN
INTRAVENOUS | Status: AC
  Administered 2016-06-26: 2 g via INTRAVENOUS
  Filled 2016-06-26: qty 50

## 2016-06-26 MED ORDER — INSULIN ASPART 100 UNIT/ML ~~LOC~~ SOLN
0.0000 [IU] | Freq: Every day | SUBCUTANEOUS | Status: DC
  Administered 2016-06-26: 4 [IU] via SUBCUTANEOUS
  Administered 2016-06-27: 3 [IU] via SUBCUTANEOUS
  Administered 2016-06-28: 2 [IU] via SUBCUTANEOUS

## 2016-06-26 MED ORDER — LORATADINE 10 MG PO TABS
10.0000 mg | ORAL_TABLET | Freq: Every day | ORAL | Status: DC
  Administered 2016-06-27 – 2016-06-29 (×3): 10 mg via ORAL
  Filled 2016-06-26 (×3): qty 1

## 2016-06-26 MED ORDER — L-METHYLFOLATE-B6-B12 3-35-2 MG PO TABS
1.0000 | ORAL_TABLET | Freq: Every day | ORAL | Status: DC
  Administered 2016-06-27 – 2016-06-29 (×3): 1 via ORAL
  Filled 2016-06-26 (×3): qty 1

## 2016-06-26 MED ORDER — IPRATROPIUM-ALBUTEROL 0.5-2.5 (3) MG/3ML IN SOLN
3.0000 mL | Freq: Four times a day (QID) | RESPIRATORY_TRACT | Status: DC
  Administered 2016-06-27 – 2016-06-29 (×8): 3 mL via RESPIRATORY_TRACT
  Filled 2016-06-26 (×11): qty 3

## 2016-06-26 MED ORDER — MAGNESIUM SULFATE 50 % IJ SOLN
2.0000 g | Freq: Once | INTRAMUSCULAR | Status: DC
  Filled 2016-06-26: qty 4

## 2016-06-26 MED ORDER — ADULT MULTIVITAMIN W/MINERALS CH
1.0000 | ORAL_TABLET | Freq: Every day | ORAL | Status: DC
  Administered 2016-06-27 – 2016-06-29 (×3): 1 via ORAL
  Filled 2016-06-26 (×3): qty 1

## 2016-06-26 MED ORDER — DEXTROSE 5 % IV SOLN
500.0000 mg | Freq: Once | INTRAVENOUS | Status: AC
  Administered 2016-06-26: 500 mg via INTRAVENOUS

## 2016-06-26 MED ORDER — METHYLPREDNISOLONE SODIUM SUCC 125 MG IJ SOLR
125.0000 mg | Freq: Once | INTRAMUSCULAR | Status: AC
  Administered 2016-06-26: 125 mg via INTRAVENOUS
  Filled 2016-06-26: qty 2

## 2016-06-26 MED ORDER — ASPIRIN 325 MG PO TABS
325.0000 mg | ORAL_TABLET | Freq: Every day | ORAL | Status: DC
  Administered 2016-06-27 – 2016-06-29 (×3): 325 mg via ORAL
  Filled 2016-06-26 (×3): qty 1

## 2016-06-26 MED ORDER — METHYLPREDNISOLONE SODIUM SUCC 125 MG IJ SOLR
60.0000 mg | Freq: Four times a day (QID) | INTRAMUSCULAR | Status: DC
  Administered 2016-06-26 – 2016-06-28 (×7): 60 mg via INTRAVENOUS
  Filled 2016-06-26 (×7): qty 2

## 2016-06-26 MED ORDER — ALBUTEROL SULFATE (2.5 MG/3ML) 0.083% IN NEBU
2.5000 mg | INHALATION_SOLUTION | Freq: Once | RESPIRATORY_TRACT | Status: AC
  Administered 2016-06-26: 2.5 mg via RESPIRATORY_TRACT
  Filled 2016-06-26: qty 3

## 2016-06-26 NOTE — Progress Notes (Signed)
Attending changed to Dr. Marland McalpineSheikh.  Dr. Marland McalpineSheikh paged about patient's blood pressure at 163/100 and patient needing a diet order.  Will continue to monitor patient.

## 2016-06-26 NOTE — Progress Notes (Signed)
Patient's blood pressure upon arrival to unit is 163/100. Patient states " I did not take my losartan for today."  Dr. Arthor CaptainElmahi [facility].  Will continue to monitor patient.

## 2016-06-26 NOTE — ED Provider Notes (Signed)
[facility]   CSN: [id number] Arrival date & time: 06/26/16  1210     History   Chief Complaint Chief Complaint  Patient presents with  . Shortness of Breath    HPI Richard Padilla is a 71 y.o. male.  Patient is a 71 year old male with past medical history of COPD, hypertension, obstructive sleep apnea. He presents today with complaints of shortness of breath. This has been worsening over the past 2 weeks. He was seen here 5 days ago and treated for a COPD exacerbation. He has completed his course of prednisone, however his wheezing has returned. He denies any fevers or chills. He does report some chest congestion and productive cough.   The history is provided by the patient.  Shortness of Breath  This is a recurrent problem. The average episode lasts 2 weeks. The problem has been gradually worsening. Associated symptoms include cough and sputum production. Pertinent negatives include no fever and no chest pain. He has tried inhaled steroids and beta-agonist inhalers for the symptoms. The treatment provided no relief. Associated medical issues include COPD. Associated medical issues do not include pneumonia, CAD or heart failure.    Past Medical History:  Diagnosis Date  . Hypercholesteremia   . Hypertension   . Migraines   . Sleep apnea   . Thyroid disease     Patient Active Problem List   Diagnosis Date Noted  . CAP (community acquired pneumonia) 09/20/2011  . COPD with acute exacerbation (HCC) 09/09/2011  . HTN (hypertension) 09/09/2011  . Hypothyroidism 09/09/2011  . Hyperlipidemia 09/09/2011  . OSA on CPAP 09/09/2011  . Constipation 09/09/2011  . Hyperglycemia 09/09/2011  . Acute respiratory failure with hypercapnia (HCC) 09/09/2011    Past Surgical History:  Procedure Laterality Date  . BACK SURGERY    . THYROID SURGERY         Home Medications    Prior to Admission medications   Medication Sig Start Date End Date Taking?  Authorizing Provider  acetaminophen (TYLENOL) 500 MG tablet Take 500 mg by mouth daily. For migraine     [provider]  albuterol (PROVENTIL) (5 MG/ML) 0.5% nebulizer solution Take 0.5 mLs (2.5 mg total) by nebulization every 4 (four) hours as needed for wheezing or shortness of breath. 09/12/11 06/22/16  Dorothea Ogle, MD  aspirin 325 MG tablet Take 325 mg by mouth daily.    [provider]  benzonatate (TESSALON) 100 MG capsule Take 1 capsule (100 mg total) by mouth 3 (three) times daily as needed for cough. 06/22/16   Tilden Fossa, MD  budesonide (PULMICORT) 0.5 MG/2ML nebulizer solution Take 2 mLs (0.5 mg total) by nebulization 2 (two) times daily. 09/12/11 06/22/16  Dorothea Ogle, MD  fluticasone [facility]) 50 MCG/ACT nasal spray Place 1-2 sprays into the nose daily. 09/19/11 06/22/16  Parrett, Virgel Bouquet, NP  hydrALAZINE (APRESOLINE) 25 MG tablet Take 25 mg by mouth 3 (three) times daily.    [provider]  ipratropium (ATROVENT) 0.02 % nebulizer solution Take 2.5 mLs (0.5 mg total) by nebulization every 6 (six) hours as needed for wheezing. 09/12/11 06/22/16  Dorothea Ogle, MD  l-methylfolate-B6-B12 [facility]) 3-35-2 MG TABS Take 1 tablet by mouth daily.    [provider]  levothyroxine (SYNTHROID, LEVOTHROID) 125 MCG tablet Take 125 mcg by mouth daily.    [provider]  levothyroxine (SYNTHROID, LEVOTHROID) 200 MCG tablet Take 200 mcg by mouth daily before breakfast.    [provider]  lisinopril (PRINIVIL,ZESTRIL) 2.5 MG tablet Take 2.5 mg by mouth daily.    [provider]  loratadine (CLARITIN) 10 MG tablet Take 10 mg by mouth daily.    [provider]  losartan (COZAAR) 25 MG tablet Take 1 tablet (25 mg total) by mouth daily. 09/12/11 06/22/16  Dorothea OgleMyers, Iskra M, MD  meloxicam (MOBIC) 15 MG tablet Take 7.5 mg by mouth daily.    [provider]  metFORMIN (GLUCOPHAGE) 500 MG tablet Take 1 tablet (500 mg total) by mouth 2  (two) times daily with a meal. 09/12/11 06/22/16  Dorothea OgleMyers, Iskra M, MD  mometasone-formoterol (DULERA) 100-5 MCG/ACT AERO Inhale 2 puffs into the lungs 2 (two) times daily. 09/12/11   Dorothea OgleMyers, Iskra M, MD  Multiple Vitamins-Minerals (ALIVE MENS ENERGY) TABS Take 1 tablet by mouth daily.    [provider]  omeprazole (PRILOSEC) 20 MG capsule TAKE ONE CAPSULE EVERY DAY 04/27/12   Parrett, Virgel Bouquetammy S, NP  predniSONE (DELTASONE) 10 MG tablet Take 4 tablets (40 mg total) by mouth daily. 06/22/16   Tilden Fossaees, Elizabeth, MD  propranolol ER (INDERAL LA) 120 MG 24 hr capsule Take 120 mg by mouth daily.    [provider]  rosuvastatin (CRESTOR) 40 MG tablet Take 40 mg by mouth daily.    [provider]    Family History No family history on file.  Social History Social History  Substance Use Topics  . Smoking status: Former Smoker    Packs/day: 2.00    Years: 20.00    Types: Cigarettes    Quit date: 09/08/1991  . Smokeless tobacco: Never Used  . Alcohol use 0.0 oz/week     Comment: occ     Allergies   Patient has no known allergies.   Review of Systems Review of Systems  Constitutional: Negative for fever.  Respiratory: Positive for cough, sputum production and shortness of breath.   Cardiovascular: Negative for chest pain.  All other systems reviewed and are negative.    Physical Exam Updated Vital Signs BP (!) 149/111 (BP Location: Left Arm)   Pulse 73   Temp 98.6 F (37 C) (Oral)   Resp (!) 25   Ht 6' (1.829 m)   Wt 104.8 kg (231 lb)   SpO2 94%   BMI 31.33 kg/m   Physical Exam  Constitutional: He is oriented to person, place, and time. He appears well-developed and well-nourished. No distress.  HENT:  Head: Normocephalic and atraumatic.  Mouth/Throat: Oropharynx is clear and moist.  Neck: Normal range of motion. Neck supple.  Cardiovascular: Normal rate and regular rhythm.  Exam reveals no friction rub.   No murmur heard. Pulmonary/Chest: Effort normal. No  respiratory distress. He has wheezes. He has no rales.  There is bilateral expiratory wheezing present. He is in mild respiratory distress.  Abdominal: Soft. Bowel sounds are normal. He exhibits no distension. There is no tenderness.  Musculoskeletal: Normal range of motion. He exhibits no edema.  Lymphadenopathy:    He has no cervical adenopathy.  Neurological: He is alert and oriented to person, place, and time. Coordination normal.  Skin: Skin is warm and dry. He is not diaphoretic.  Nursing note and vitals reviewed.    ED Treatments / Results  Labs (all labs ordered are listed, but only abnormal results are displayed) Labs Reviewed  BASIC METABOLIC PANEL  TROPONIN I  CBC WITH DIFFERENTIAL/PLATELET  BRAIN NATRIURETIC PEPTIDE    EKG  EKG Interpretation None       Radiology  No results found.  Procedures Procedures (including critical care time)  Medications Ordered in ED Medications  methylPREDNISolone sodium succinate (SOLU-MEDROL) 125 mg/2 mL injection 125 mg (not administered)  ipratropium-albuterol (DUONEB) 0.5-2.5 (3) MG/3ML nebulizer solution 3 mL (3 mLs Nebulization Given 06/26/16 1226)  albuterol (PROVENTIL) (2.5 MG/3ML) 0.083% nebulizer solution 2.5 mg (2.5 mg Nebulization Given 06/26/16 1226)     Initial Impression / Assessment and Plan / ED Course  I have reviewed the triage vital signs and the nursing notes.  Pertinent labs & imaging results that were available during my care of the patient were reviewed by me and considered in my medical decision making (see chart for details).  Patient with history of COPD presenting with shortness of breath, wheezing, difficulty breathing that has worsened over the past 2 weeks. He was seen here on Saturday with similar complaints. He was given breathing treatments, steroids, however is again worsening.  He was given a DuoNeb, Solu-Medrol, magnesium, and an hour-long albuterol treatment, however continues to wheeze and  have difficulty breathing. His initial saturations were 87% in triage, improved to 92% after breathing treatments, then dropped to 82% with ambulation. He also reported some weakness and lightheadedness with ambulation as well.  I feel as though he will require admission as he has failed outpatient therapy and remains hypoxic. I've spoken with Dr. Arthor Captain who agrees to admit.  CRITICAL CARE Performed by: Geoffery Lyons Total critical care time: 45 minutes Critical care time was exclusive of separately billable procedures and treating other patients. Critical care was necessary to treat or prevent imminent or life-threatening deterioration. Critical care was time spent personally by me on the following activities: development of treatment plan with patient and/or surrogate as well as nursing, discussions with consultants, evaluation of patient's response to treatment, examination of patient, obtaining history from patient or surrogate, ordering and performing treatments and interventions, ordering and review of laboratory studies, ordering and review of radiographic studies, pulse oximetry and re-evaluation of patient's condition.   Final Clinical Impressions(s) / ED Diagnoses   Final diagnoses:  None    New Prescriptions New Prescriptions   No medications on file     Geoffery Lyons, MD 06/26/16 1512

## 2016-06-26 NOTE — Progress Notes (Signed)
Page: Richard Padilla [id number] reports he takes metformin BID and it was not ordered his CBG tonight is 320

## 2016-06-26 NOTE — H&P (Signed)
History and Physical    Richard Burowathaniel Ebbert ZOX:[id number] DOB: 1945-06-10 DOA: 06/26/2016  PCP: System, Pcp Not In   Patient coming from: Home via [facility]  Chief Complaint: Shortness of Breath  HPI: Richard Padilla is a 71 y.o. male with medical history significant of HTN, HLD, COPD, Migraines, Sleep Apnea, Hypothyroidism and other comorbids who presents with worsening SOB over the last 2 weeks. Patient states he has been Coughing up sputum and has been white/yellow. He states he gets SOB lying down and SOB on Exertion and has felt weaker and has had more difficulty breathing. States he has had No Energy and denies any sick contacts. States he has been Wheezing significantly and feels congested. Patient denies any other complaints or concerns and denies CP, lightheadedness dizziness, urinary discomfort, however felt lightheaded on ambulation. Patient does admit to Constipation. TRH was called to [facility] for a COPD Exacerbation. Of note patient was seen in the ED twice with similar complaints (last visit was on Saturday 06/22/16) and was placed on a Prednisone taper as an outpatient with no real improvement.   ED Course: Had blood work done as well as a continuous Neb, Magnesium, Solumedrol, DuoNeb, and CXR. He was placed on Supplemental O2.   Review of Systems: As per HPI otherwise 10 point review of systems negative.   Past Medical History:  Diagnosis Date  . Hypercholesteremia   . Hypertension   . Migraines   . Sleep apnea   . Thyroid disease     Past Surgical History:  Procedure Laterality Date  . BACK SURGERY    . THYROID SURGERY     SOCIAL HISTORY  reports that he quit smoking about 24 years ago. His smoking use included Cigarettes. He has a 40.00 pack-year smoking history. He has never used smokeless tobacco. He reports that he drinks alcohol. He reports that he does not use drugs.  No Known Allergies  FAMILY HISTORY Per  Patient  Aunts and Uncles had Heart Trouble. Mom had HTN; Did not know Dad and no family Hx is know about patient's Father.   Prior to Admission medications   Medication Sig Start Date End Date Taking? Authorizing Provider  acetaminophen (TYLENOL) 500 MG tablet Take 500 mg by mouth daily. For migraine     [provider]  albuterol (PROVENTIL) (5 MG/ML) 0.5% nebulizer solution Take 0.5 mLs (2.5 mg total) by nebulization every 4 (four) hours as needed for wheezing or shortness of breath. 09/12/11 06/22/16  Dorothea OgleMyers, Iskra M, MD  aspirin 325 MG tablet Take 325 mg by mouth daily.    [provider]  benzonatate (TESSALON) 100 MG capsule Take 1 capsule (100 mg total) by mouth 3 (three) times daily as needed for cough. 06/22/16   Tilden Fossaees, Elizabeth, MD  budesonide (PULMICORT) 0.5 MG/2ML nebulizer solution Take 2 mLs (0.5 mg total) by nebulization 2 (two) times daily. 09/12/11 06/22/16  Dorothea OgleMyers, Iskra M, MD  fluticasone Aleda Grana(FLONASE) 50 MCG/ACT nasal spray Place 1-2 sprays into the nose daily. 09/19/11 06/22/16  Parrett, Virgel Bouquetammy S, NP  hydrALAZINE (APRESOLINE) 25 MG tablet Take 25 mg by mouth 3 (three) times daily.    [provider]  ipratropium (ATROVENT) 0.02 % nebulizer solution Take 2.5 mLs (0.5 mg total) by nebulization every 6 (six) hours as needed for wheezing. 09/12/11 06/22/16  Dorothea OgleMyers, Iskra M, MD  l-methylfolate-B6-B12 [facility](METANX) 3-35-2 MG TABS Take 1 tablet by mouth daily.    [provider]  levothyroxine (SYNTHROID, LEVOTHROID) 125 MCG tablet Take 125 mcg by mouth daily.    [provider]  levothyroxine (SYNTHROID, LEVOTHROID) 200 MCG tablet Take 200 mcg by mouth daily before breakfast.    [provider]  lisinopril (PRINIVIL,ZESTRIL) 2.5 MG tablet Take 2.5 mg by mouth daily.    [provider]  loratadine (CLARITIN) 10 MG tablet Take 10 mg by mouth daily.    [provider]  losartan (COZAAR) 25 MG tablet Take 1 tablet (25 mg total) by mouth  daily. 09/12/11 06/22/16  Dorothea Ogle, MD  meloxicam (MOBIC) 15 MG tablet Take 7.5 mg by mouth daily.    [provider]  metFORMIN (GLUCOPHAGE) 500 MG tablet Take 1 tablet (500 mg total) by mouth 2 (two) times daily with a meal. 09/12/11 06/22/16  Dorothea Ogle, MD  mometasone-formoterol (DULERA) 100-5 MCG/ACT AERO Inhale 2 puffs into the lungs 2 (two) times daily. 09/12/11   Dorothea Ogle, MD  Multiple Vitamins-Minerals (ALIVE MENS ENERGY) TABS Take 1 tablet by mouth daily.    [provider]  omeprazole (PRILOSEC) 20 MG capsule TAKE ONE CAPSULE EVERY DAY 04/27/12   Parrett, Virgel Bouquet, NP  predniSONE (DELTASONE) 10 MG tablet Take 4 tablets (40 mg total) by mouth daily. 06/22/16   Tilden Fossa, MD  propranolol ER (INDERAL LA) 120 MG 24 hr capsule Take 120 mg by mouth daily.    [provider]  rosuvastatin (CRESTOR) 40 MG tablet Take 40 mg by mouth daily.    [provider]    Physical Exam: Vitals:   06/26/16 1500 06/26/16 1530 06/26/16 1600 06/26/16 1704  BP: (!) 158/90 (!) 144/87 140/83 (!) 163/100  Pulse: 62 62 64 69  Resp: (!) 26 17 (!) 24 20  Temp:    98.6 F (37 C)  TempSrc:    Oral  SpO2: 96% 95% 95% 100%  Weight:    104.3 kg (230 lb)  Height:    6' (1.829 m)   Constitutional: WN/WD, NAD and appears calm and comfortable sitting in chair bedside Eyes: Lids and conjunctivae normal, sclerae anicteric  ENMT: External Ears, Nose appear normal. Grossly normal hearing. Mucous membranes are moist.  Neck: Appears normal, supple, no cervical masses, normal ROM, no appreciable thyromegaly, no appreciable JVD Respiratory: Diminished to auscultation bilaterally with some wheezing and minimal rhonchi. Normal respiratory effort and patient is not tachypenic. No accessory muscle use but patient is wearing supplemental O2 via [place].  Cardiovascular: RRR, no murmurs / rubs / gallops. S1 and S2 auscultated. 1+ Lower extremity edema.  Abdomen: Firm, non-tender,  Distended due to body habitus. No masses palpated. No appreciable hepatosplenomegaly. Bowel sounds positive x4.  GU: Deferred. Musculoskeletal: No clubbing / cyanosis of digits/nails. No joint deformity upper and lower extremities.  Skin: No rashes, lesions, ulcers on limited skin evaluation. No induration; Warm and dry.  Neurologic: CN 2-12 grossly intact with no focal deficits. Romberg sign cerebellar reflexes not assessed.  Psychiatric: Normal judgment and insight. Alert and oriented x 3. Normal mood and appropriate affect.   Labs on Admission: I have personally reviewed following labs and imaging studies  CBC:  Recent Labs Lab 06/22/16 0835 06/26/16 1243  WBC 5.5 6.4  NEUTROABS 3.0 4.4  HGB 12.5* 12.9*  HCT 38.3* 39.6  MCV 88.9 89.0  PLT 190 188   Basic Metabolic Panel:  Recent Labs Lab 06/22/16 0835 06/26/16 1243  NA 141 139  K 4.2 3.7  CL 98* 100*  CO2  35* 34*  GLUCOSE 137* 183*  BUN 23* 20  CREATININE 1.18 1.07  CALCIUM 8.9 8.7*   GFR: Estimated Creatinine Clearance: 82.5 mL/min (by C-G formula based on SCr of 1.07 mg/dL). Liver Function Tests: No results for input(s): AST, ALT, ALKPHOS, BILITOT, PROT, ALBUMIN in the last 168 hours. No results for input(s): LIPASE, AMYLASE in the last 168 hours. No results for input(s): AMMONIA in the last 168 hours. Coagulation Profile: No results for input(s): INR, PROTIME in the last 168 hours. Cardiac Enzymes:  Recent Labs Lab 06/22/16 0835 06/26/16 1243  TROPONINI <0.03 <0.03   BNP (last 3 results) No results for input(s): PROBNP in the last 8760 hours. HbA1C: No results for input(s): HGBA1C in the last 72 hours. CBG:  Recent Labs Lab 06/26/16 1843  GLUCAP 239*   Lipid Profile: No results for input(s): CHOL, HDL, LDLCALC, TRIG, CHOLHDL, LDLDIRECT in the last 72 hours. Thyroid Function Tests: No results for input(s): TSH, T4TOTAL, FREET4, T3FREE, THYROIDAB in the last 72 hours. Anemia Panel: No results  for input(s): VITAMINB12, FOLATE, FERRITIN, TIBC, IRON, RETICCTPCT in the last 72 hours. Urine analysis: No results found for: COLORURINE, APPEARANCEUR, LABSPEC, PHURINE, GLUCOSEU, HGBUR, BILIRUBINUR, KETONESUR, PROTEINUR, UROBILINOGEN, NITRITE, LEUKOCYTESUR Sepsis Labs: !!!!!!!!!!!!!!!!!!!!!!!!!!!!!!!!!!!!!!!!!!!! @LABRCNTIP (procalcitonin:4,lacticidven:4) )No results found for this or any previous visit (from the past 240 hour(s)).   Radiological Exams on Admission: Dg Chest 2 View  Result Date: 06/26/2016 CLINICAL DATA:  Shortness of breath. EXAM: CHEST  2 VIEW COMPARISON:  06/22/2016.  05/11/2015 . FINDINGS: Heart size normal. Mild right base pleuroparenchymal thickening again noted consistent with scarring. No acute pulmonary disease. No pleural effusion or pneumothorax . Thoracic spine scoliosis with prior fusion again noted. Deformity right ribs again noted. Surgical clips upper abdomen. IMPRESSION: No acute cardiopulmonary disease. Electronically Signed   By: Maisie Fus  Register   On: 06/26/2016 12:52   EKG: Independently reviewed. Showed a Sinus Rhythm with a rate of 66 and no appreciable ST Elevation or Depression on my interpretation.   Assessment/Plan Principal Problem:   COPD exacerbation (HCC) Active Problems:   HTN (hypertension)   Hypothyroidism   Hyperlipidemia   OSA on CPAP   Constipation   Hyperglycemia   COPD (chronic obstructive pulmonary disease) (HCC)   Acute respiratory failure with hypoxia (HCC)   Leg swelling  Acute Respiratory Failure with Hypoxia likely 2/2 to COPD Exacerbation -Admit to Inpatient Telemetry -Continuous Pulse Oximetry and Maintain O2 Saturations > 92% -C/w Supplemental O2 and Wean O2 as Tolerated -C/w Pulmonary Toilet -Added Flutter Valve and Incentive Spirometry along with IV Steroids -Repeat CXR in AM -Check Respiratory Virus Panel  -Will need Walk Screen prior to D/C  Acute COPD Exacerbation -Scheduled DuoNebs q6h and Albuterol Nebs  q2hprn -C/w IV Steroids 60 mg q6h -Added Azithromycin IV -C/w Supplemental O2 -Flutter Valve and Mucienx -C/w Home Dulera  HTN -BP was elevated on Admission -Will need to figure out accurate List as Lisinopril and Losartan have been listed -C/w Propanolol 120 mg po Daily -C/w Hydralazine po 25 mg TID -Hydralazine IV 10 mg q6hprn for SBP >180 or DBP > 100  Diabetes Mellitus Type 2 -Hold Home Metformin -Place on Moderate Intensity Novolog SSI AC -Continue to Monitor CBG's -Check HbA1c  OSA on Home BiPAP -C/w Home BiPAP   HLD -Check Lipid Panel -C/w Home Rosuvastatin 40 mg po Daily  Hypothyroidism -Check TSH and Free T4 -Unclear whether patient takes 200 mcg or 125 mcg Daily but will continue 125 mcg for now  Lower  Extremity Swelling/Mild Edema -Patient's BNP was 96.6 however maybe low in Obesity -Check ECHOCARDIOGRAM to evaluate Systolic and Diastolic Function -Strict I's/O's, Daily Weights  Constipation -Start Stool Softener  DVT prophylaxis: Enoxaparin 40 mg sq q24h Code Status: FULL CODE Family Communication: Discussed with Wife at bedside Disposition Plan: Home at D/C likely  Consults called: None Admission status: Inpatient Telemetry   Severity of Illness: The appropriate patient status for this patient is INPATIENT. Inpatient status is judged to be reasonable and necessary in order to provide the required intensity of service to ensure the patient's safety. The patient's presenting symptoms, physical exam findings, and initial radiographic and laboratory data in the context of their chronic comorbidities is felt to place them at high risk for further clinical deterioration. Furthermore, it is not anticipated that the patient will be medically stable for discharge from the hospital within 2 midnights of admission. The following factors support the patient status of inpatient.   "           The patient's presenting symptoms include acute respiratory failure with  hypoxia requiring supplemental O2. "           The worrisome physical exam findings include diminished breath sounds with some expiratory wheezing. "           The initial radiographic and laboratory data are worrisome because of hyperglycemia and patient will need monitoring due to being on IV Steroids q6h. "           The chronic co-morbidities include HTN, HLD, COPD, OSA.  * I certify that at the point of admission it is my clinical judgment that the patient will require inpatient hospital care spanning beyond 2 midnights from the point of admission due to high intensity of service, high risk for further deterioration and high frequency of surveillance required.Merlene Laughter, D.O. [facility] Pager [phone]  If 7PM-7AM, please contact night-coverage [email or web address] Password [email or web address]  06/26/2016, 7:15 PM

## 2016-06-26 NOTE — ED Notes (Signed)
RN unable to take report at this time. Message left and he will call back

## 2016-06-26 NOTE — ED Notes (Signed)
RT at bedside.

## 2016-06-26 NOTE — Progress Notes (Signed)
PHARMACY - ENOXAPARIN  Enoxaparin 40mg  sq q24h ordered for VTE prophylaxis and pharmacy asked to adjust dose as needed  Height = 72 inches Weight = 104.3 kg BMI = 31 CrCl = 82.5 ml/min  Assessment:  BMI > 30 therefore dose adjustment (0.5mg /kg/q24h) recommended per manufacturer guidelines  Plan: Change Enoxaparin to 50mg  sq q24h for VTE prophylaxis  Terrilee FilesLeann Kyonna Frier, PharmD

## 2016-06-26 NOTE — Significant Event (Signed)
  PENDING ACCEPTANCE TRANFER NOTE:  Call received from:    [facility]  REASON FOR REQUESTING TRANSFER:    COPD exacerbation  CC: SOB  HPI:  71 year old with a history of COPD, came in to the ED for second time complaining about SOB, wheezing and minimally productive cough, he was placed on our long nebulization, did not improve his wheezing. Desaturated to 81% with ambulation, admitted for COPD exacerbation.   PLAN:  According to telephone report, this patient was accepted for [facility], under TRH team:  [facility],  I have requested an order be written to call Flow Manager at [phone] upon patient arrival to the floor for final physician assignment who will do the admission and give admitting orders.  SIGNED: Clint LippsELMAHI,[place] Athanas A, MD [facility]  06/26/2016, 3:10 PM

## 2016-06-26 NOTE — ED Triage Notes (Signed)
C/o SOB x 5 days-seen here for same 6/16-placed in w/c after reg and taken to triage-dyspnea noted-states worse with ambulation

## 2016-06-26 NOTE — ED Notes (Addendum)
Patient ambulated in hall on room air, RR 24, HR 85-90, SpO2 92% but dropped to 83% with exertion.  Return to rm 10 EDP aware placed on 2l/m [place] SpO2 now 97%.

## 2016-06-27 ENCOUNTER — Ambulatory Visit (HOSPITAL_COMMUNITY): Payer: Medicare Other

## 2016-06-27 ENCOUNTER — Inpatient Hospital Stay (HOSPITAL_COMMUNITY): Payer: Medicare Other

## 2016-06-27 DIAGNOSIS — I5030 Unspecified diastolic (congestive) heart failure: Secondary | ICD-10-CM

## 2016-06-27 DIAGNOSIS — J441 Chronic obstructive pulmonary disease with (acute) exacerbation: Principal | ICD-10-CM

## 2016-06-27 LAB — GLUCOSE, CAPILLARY
GLUCOSE-CAPILLARY: 239 mg/dL — AB (ref 65–99)
GLUCOSE-CAPILLARY: 283 mg/dL — AB (ref 65–99)
Glucose-Capillary: 237 mg/dL — ABNORMAL HIGH (ref 65–99)
Glucose-Capillary: 301 mg/dL — ABNORMAL HIGH (ref 65–99)

## 2016-06-27 LAB — CBC
HEMATOCRIT: 37.2 % — AB (ref 39.0–52.0)
Hemoglobin: 12.1 g/dL — ABNORMAL LOW (ref 13.0–17.0)
MCH: 28.5 pg (ref 26.0–34.0)
MCHC: 32.5 g/dL (ref 30.0–36.0)
MCV: 87.7 fL (ref 78.0–100.0)
PLATELETS: 201 10*3/uL (ref 150–400)
RBC: 4.24 MIL/uL (ref 4.22–5.81)
RDW: 14.3 % (ref 11.5–15.5)
WBC: 8.3 10*3/uL (ref 4.0–10.5)

## 2016-06-27 LAB — COMPREHENSIVE METABOLIC PANEL
ALBUMIN: 3.6 g/dL (ref 3.5–5.0)
ALT: 43 U/L (ref 17–63)
AST: 24 U/L (ref 15–41)
Alkaline Phosphatase: 55 U/L (ref 38–126)
Anion gap: 7 (ref 5–15)
BUN: 23 mg/dL — ABNORMAL HIGH (ref 6–20)
CO2: 31 mmol/L (ref 22–32)
CREATININE: 0.98 mg/dL (ref 0.61–1.24)
Calcium: 8.5 mg/dL — ABNORMAL LOW (ref 8.9–10.3)
Chloride: 101 mmol/L (ref 101–111)
GFR calc Af Amer: >60 mL/min (ref >60)
GLUCOSE: 243 mg/dL — AB (ref 65–99)
POTASSIUM: 4.6 mmol/L (ref 3.5–5.1)
SODIUM: 139 mmol/L (ref 135–145)
Total Bilirubin: 0.5 mg/dL (ref 0.3–1.2)
Total Protein: 6.9 g/dL (ref 6.5–8.1)

## 2016-06-27 LAB — HEMOGLOBIN A1C
HEMOGLOBIN A1C: 7.9 % — AB (ref 4.8–5.6)
MEAN PLASMA GLUCOSE: 180 mg/dL

## 2016-06-27 LAB — ECHOCARDIOGRAM COMPLETE
Height: 72 in
Weight: 3679.04 oz

## 2016-06-27 MED ORDER — LOSARTAN POTASSIUM 50 MG PO TABS
25.0000 mg | ORAL_TABLET | Freq: Every day | ORAL | Status: DC
  Administered 2016-06-27 – 2016-06-29 (×3): 25 mg via ORAL
  Filled 2016-06-27 (×3): qty 1

## 2016-06-27 MED ORDER — POLYETHYLENE GLYCOL 3350 17 G PO PACK
17.0000 g | PACK | Freq: Every day | ORAL | Status: DC
  Administered 2016-06-27 – 2016-06-29 (×3): 17 g via ORAL
  Filled 2016-06-27 (×3): qty 1

## 2016-06-27 MED ORDER — AZITHROMYCIN 250 MG PO TABS
250.0000 mg | ORAL_TABLET | Freq: Every day | ORAL | Status: AC
  Administered 2016-06-27 – 2016-06-29 (×3): 250 mg via ORAL
  Filled 2016-06-27 (×3): qty 1

## 2016-06-27 MED ORDER — DOCUSATE SODIUM 100 MG PO CAPS
100.0000 mg | ORAL_CAPSULE | Freq: Two times a day (BID) | ORAL | Status: DC
  Administered 2016-06-27 – 2016-06-29 (×4): 100 mg via ORAL
  Filled 2016-06-27 (×5): qty 1

## 2016-06-27 MED ORDER — PREMIER PROTEIN SHAKE
11.0000 [oz_av] | Freq: Two times a day (BID) | ORAL | Status: DC
  Administered 2016-06-27 – 2016-06-29 (×5): 11 [oz_av] via ORAL
  Filled 2016-06-27 (×5): qty 325.31

## 2016-06-27 MED ORDER — PHENOL 1.4 % MT LIQD
1.0000 | OROMUCOSAL | Status: DC | PRN
  Filled 2016-06-27: qty 177

## 2016-06-27 NOTE — Progress Notes (Signed)
Nutrition Education Note  RD provided " Nutrition therapy with COPD" handout from the Academy of [employer] and Dietetics. RD educated pt on the importance of adequate protein intake with COPD to preserve lean muscle mass.   RD provided "Nutirtion Therapy with Type II Diabetes" handout from the Academy of [employer] and [employer]etics. Discussed different food groups and their effects on blood sugar, emphasizing carbohydrate-containing foods. Provided list of carbohydrates and recommended serving sizes of common foods.  Discussed importance of controlled and consistent carbohydrate intake throughout the day. Provided examples of ways to balance meals/snacks and encouraged intake of high-fiber, whole grain complex carbohydrates.   Teach back method used.  Expect good compliance.  Body mass index is 31.19 kg/m.   Current diet order is heart healthy/carbohydrate controlled, patient is consuming approximately 100% of meals at this time. Labs and medications reviewed. No further nutrition interventions warranted at this time. RD contact information provided. If additional nutrition issues arise, please re-consult RD.  Betsey Holidayasey Rylend Pietrzak MS, RD, LDN Pager #- [phone]

## 2016-06-27 NOTE — Evaluation (Signed)
Occupational Therapy Evaluation Patient Details Name: Richard Padilla MRN: [id number] DOB: Dec 17, 1945 Today's Date: 06/27/2016    History of Present Illness 71 y.o. male with medical history significant of HTN, HLD, COPD, Migraines, Sleep Apnea, Hypothyroidism and other comorbids who presents with worsening SOB over the last 2 weeks.    Clinical Impression   Pt admitted with the above diagnoses and presents with below problem list. Pt will benefit from continued acute OT to address the below listed deficits and maximize independence with basic ADLs prior to d/c home. PTA pt was independent with ADLs. Pt presents with decreased activity tolerance, needing 2L supplemental O2 during session for OOB ADLs. Pt is currently setup to supervision with ADLs.  [place].      Follow Up Recommendations  No OT follow up    Equipment Recommendations  None recommended by OT    Recommendations for Other Services       Precautions / Restrictions Precautions Precaution Comments: watch O2 Restrictions Weight Bearing Restrictions: No      Mobility Bed Mobility Overal bed mobility: Modified Independent                Transfers Overall transfer level: Needs assistance Equipment used: None Transfers: Sit to/from Stand Sit to Stand: Supervision              Balance Overall balance assessment: No apparent balance deficits (not formally assessed)                                         ADL either performed or assessed with clinical judgement   ADL Overall ADL's : Needs assistance/impaired Eating/Feeding: Set up;Sitting   Grooming: Set up;Supervision/safety;Oral care;Standing   Upper Body Bathing: Set up;Sitting   Lower Body Bathing: Supervison/ safety;Sit to/from stand   Upper Body Dressing : Set up;Sitting   Lower Body Dressing: Supervision/safety;Set up;Sit to/from stand   Toilet Transfer: Supervision/safety    Toileting- ArchitectClothing Manipulation and Hygiene: Supervision/safety   Tub/ Engineer, structuralhower Transfer: Supervision/safety;Walk-in shower   Functional mobility during ADLs: Supervision/safety General ADL Comments: Pt completed toilet and shower transfers, in-room functional mobility and oral care standing at sink.      Vision Baseline Vision/History: Wears glasses       Perception     Praxis      Pertinent Vitals/Pain Pain Assessment: Faces Faces Pain Scale: Hurts a little bit Pain Location: pt reports chronic back pain Pain Intervention(s): Monitored during session     Hand Dominance     Extremity/Trunk Assessment Upper Extremity Assessment Upper Extremity Assessment: Overall WFL for tasks assessed   Lower Extremity Assessment Lower Extremity Assessment: Defer to PT evaluation       Communication Communication Communication: No difficulties   Cognition Arousal/Alertness: Awake/alert Behavior During Therapy: WFL for tasks assessed/performed;Anxious Overall Cognitive Status: Within Functional Limits for tasks assessed                                     General Comments  Pt on RA upon therapist arrival with O2 reading at 88-91. Discussed with nurse. 2L supplemental O2 utilited during when OOB/mobility with O2 around 91-93.     Exercises     Shoulder Instructions      Home Living Family/patient expects to be discharged to:: Private  residence Living Arrangements: Alone Available Help at Discharge: Available PRN/intermittently Type of Home: House Home Access: Level entry     Home Layout: One level     Bathroom Shower/Tub: Runner, broadcasting/film/video: None;Shower seat   Additional Comments: [place]       Prior Functioning/Environment Level of Independence: Independent                 OT Problem List: Decreased activity tolerance;Decreased knowledge of use of DME or AE;Decreased knowledge of  precautions;Pain;Cardiopulmonary status limiting activity      OT Treatment/Interventions: Self-care/ADL training;Therapeutic exercise;Energy conservation;DME and/or AE instruction;Therapeutic activities;Patient/family education;Balance training    OT Goals(Current goals can be found in the care plan section) Acute Rehab OT Goals Patient Stated Goal: home when able OT Goal Formulation: With patient Time For Goal Achievement: 07/04/16 Potential to Achieve Goals: Good ADL Goals Pt Will Perform Grooming: Independently;standing (including gathering items, 2-3 grooming tasks) Additional ADL Goal #1: Pt will independently verbalize 2 energy conservation techniques to incorporate into ADLs.  OT Frequency: Min 2X/week   Barriers to D/C:            Co-evaluation              AM-PAC PT "6 Clicks" Daily Activity     Outcome Measure Help from another person eating meals?: None Help from another person taking care of personal grooming?: None Help from another person toileting, which includes using toliet, bedpan, or urinal?: None Help from another person bathing (including washing, rinsing, drying)?: A Little Help from another person to put on and taking off regular upper body clothing?: None Help from another person to put on and taking off regular lower body clothing?: None 6 Click Score: 23   End of Session Equipment Utilized During Treatment: Oxygen Nurse Communication: Other (comment) (O2 sats at start of session, reapplied [place] @ 2L for mobility)  Activity Tolerance: Other (comment);Patient tolerated treatment well (needed supplemental O2 for mobility) Patient left: Other (comment) (with PT)  OT Visit Diagnosis: Other (comment) (decreased activity tolerance (oxygen))                Time: 1610-9604 OT Time Calculation (min): 22 min Charges:  OT General Charges $OT Visit: 1 Procedure OT Evaluation $OT Eval Low Complexity: 1 Procedure G-Codes:       Pilar Grammes 06/27/2016, 10:25 AM

## 2016-06-27 NOTE — Evaluation (Addendum)
Physical Therapy Evaluation Patient Details Name: Leveon Pelzer MRN: [id number] DOB: 1946-01-05 Today's Date: 06/27/2016     SATURATION QUALIFICATIONS: (This note is used to comply with regulatory documentation for home oxygen)   Patient Saturations on Room Air while Ambulating = 85%  Patient Saturations on 2 Liters of oxygen while Ambulating = 92%    History of Present Illness  71 y.o. male with medical history significant of HTN, HLD, COPD, Migraines, Sleep Apnea, Hypothyroidism and other comorbids who presents with worsening SOB over the last 2 weeks.     Clinical Impression  On eval, pt was Min guard assist for mobility. He walked ~225 feet while pushing IV pole. Dyspnea 2/4. He participated well. Recommend daily ambulation in hallway. Will follow during hospital stay.     Follow Up Recommendations No PT follow up   Equipment Recommendations  None recommended by PT    Recommendations for Other Services       Precautions / Restrictions Precautions Precautions: Fall Precaution Comments: monitor O2 Restrictions Weight Bearing Restrictions: No      Mobility  Bed Mobility Overal bed mobility: Modified Independent             General bed mobility comments: oob in recliner  Transfers Overall transfer level: Needs assistance Equipment used: None Transfers: Sit to/from Stand Sit to Stand: Supervision            Ambulation/Gait Ambulation/Gait assistance: Min guard Ambulation Distance (Feet): 225 Feet Assistive device:  (IV pole) Gait Pattern/deviations: Step-through pattern;Decreased stride length     General Gait Details: pt tends to move quickly. O2 sats 92% on 2L O2, 85% on RA during ambulation.  Stairs            Wheelchair Mobility    Modified Rankin (Stroke Patients Only)       Balance Overall balance assessment: No apparent balance deficits (not formally assessed)                                            Pertinent Vitals/Pain Pain Assessment: No/denies pain Faces Pain Scale: Hurts a little bit Pain Location: pt reports chronic back pain Pain Intervention(s): Monitored during session    Home Living Family/patient expects to be discharged to:: Private residence Living Arrangements: Alone Available Help at Discharge: Available PRN/intermittently Type of Home: House Home Access: Level entry     Home Layout: One level Home Equipment: None;Shower seat Additional Comments[place]     Prior Function Level of Independence: Independent               Hand Dominance        Extremity/Trunk Assessment   Upper Extremity Assessment Upper Extremity Assessment: Defer to OT evaluation    Lower Extremity Assessment Lower Extremity Assessment: Generalized weakness    Cervical / Trunk Assessment Cervical / Trunk Assessment: Normal  Communication   Communication: No difficulties  Cognition Arousal/Alertness: Awake/alert Behavior During Therapy: WFL for tasks assessed/performed Overall Cognitive Status: Within Functional Limits for tasks assessed                                        General Comments General comments (skin integrity, edema, etc.): Pt on RA upon therapist arrival with O2 reading at 88-91. Discussed with nurse. 2L  supplemental O2 utilited during when OOB/mobility with O2 around 91-93.     Exercises     Assessment/Plan    PT Assessment Patient needs continued PT services  PT Problem List Decreased mobility;Decreased activity tolerance       PT Treatment Interventions Gait training;Therapeutic activities;Therapeutic exercise;Patient/family education;Functional mobility training    PT Goals (Current goals can be found in the Care Plan section)  Acute Rehab PT Goals Patient Stated Goal: home when able PT Goal Formulation: With patient Time For Goal Achievement: 07/11/16 Potential to Achieve Goals: Good    Frequency Min  3X/week   Barriers to discharge        Co-evaluation               AM-PAC PT "6 Clicks" Daily Activity  Outcome Measure Difficulty turning over in bed (including adjusting bedclothes, sheets and blankets)?: None Difficulty moving from lying on back to sitting on the side of the bed? : None Difficulty sitting down on and standing up from a chair with arms (e.g., wheelchair, bedside commode, etc,.)?: None Help needed moving to and from a bed to chair (including a wheelchair)?: None Help needed walking in hospital room?: A Little Help needed climbing 3-5 steps with a railing? : A Little 6 Click Score: 22    End of Session Equipment Utilized During Treatment: Gait belt;Oxygen Activity Tolerance: Patient limited by fatigue Patient left: in chair;with call bell/phone within reach   PT Visit Diagnosis: Muscle weakness (generalized) (M62.81);Difficulty in walking, not elsewhere classified (R26.2)    Time: 1005-1014 PT Time Calculation (min) (ACUTE ONLY): 9 min   Charges:   PT Evaluation $PT Eval Low Complexity: 1 Procedure     PT G Codes:          Rebeca AlertJannie Aaronjames Kelsay, MPT Pager: [phone]

## 2016-06-27 NOTE — Progress Notes (Signed)
Patient ID: Richard Padilla, male   DOB: 12-26-1945, 71 y.o.   MRN: [id number]  PROGRESS NOTE    Richard Padilla  WGN:[id number] DOB: 30-Sep-1945 DOA: 06/26/2016 PCP: System, Pcp Not In   Brief Narrative:  71 year old male with history of hypertension, hyperlipidemia, COPD, migraines, sleep apnea, hypothyroidism presented with worsening shortness of breath over the last 2 weeks. He was admitted for diagnosis of probable COPD exacerbation.  Assessment & Plan:   Principal Problem:   COPD exacerbation (HCC) Active Problems:   HTN (hypertension)   Hypothyroidism   Hyperlipidemia   OSA on CPAP   Constipation   Hyperglycemia   COPD (chronic obstructive pulmonary disease) (HCC)   Acute respiratory failure with hypoxia (HCC)   Leg swelling   Hypoxia likely 2/2 to COPD Exacerbation - continue oxygen supplementation. If hypoxia worsens, then will get CT chest.   Acute COPD Exacerbation - Continue intravenous Solu-Medrol. Change Zithromax to oral. Continue with DuoNebs and Dulera -C/w Supplemental O2 - Patient might benefit from outpatient pulmonary evaluation.  HTN -Monitor blood pressure. Discontinue lisinopril, continue losartan. Hold propranolol for now because of bradycardia -C/w Hydralazine po 25 mg TID   Diabetes Mellitus Type 2 -Resume metformin upon discharge -Place on Moderate Intensity Novolog SSI AC -Continue to Monitor CBG's -Follow hemoglobin A1c  OSA on Home BiPAP -C/w Home BiPAP   HLD -C/w Home Rosuvastatin 40 mg po Daily  Hypothyroidism -Continue levothyroxine  Lower Extremity Swelling/Mild Edema -Follow up 2-D echo -Strict I's/O's, Daily Weights  Obesity Follow up dietary recommendations. Outpatient follow-up  DVT prophylaxis: Enoxaparin Code Status: FULL CODE Family Communication: None present at bedside Disposition Plan: Home in 1-3 days Consults called: None   Procedures: None  Antimicrobials: Zithromax from 06/26/2016. Patient  received 1 dose of Rocephin on 06/26/2016   Subjective: Patient seen and examined at bedside. He feels much better compared to admission. He is still coughing and intermittently short of breath. No overnight fever, nausea, vomiting.  Objective: Vitals:   06/26/16 2218 06/27/16 0505 06/27/16 0751 06/27/16 0754  BP:  (!) 140/91    Pulse: 70 (!) 55    Resp: 20 20    Temp:  97.8 F (36.6 C)    TempSrc:  Oral    SpO2: 94% 96% 90% 90%  Weight:  104.3 kg (229 lb 15 oz)    Height:        Intake/Output Summary (Last 24 hours) at 06/27/16 1232 Last data filed at 06/27/16 0854  Gross per 24 hour  Intake             1090 ml  Output                0 ml  Net             1090 ml   Filed Weights   06/26/16 1218 06/26/16 1704 06/27/16 0505  Weight: 104.8 kg (231 lb) 104.3 kg (230 lb) 104.3 kg (229 lb 15 oz)    Examination:  General exam: Appears calm and comfortable  Respiratory system: Bilateral decreased breath sound at bases With scattered wheezing Cardiovascular system: S1 & S2 heard, RRR. Gastrointestinal system: Abdomen is nondistended, soft and nontender. Normal bowel sounds heard. Extremities: No cyanosis, clubbing; 1+ bilateral pitting edema present   Data Reviewed: I have personally reviewed following labs and imaging studies  CBC:  Recent Labs Lab 06/22/16 0835 06/26/16 1243 06/26/16 1919 06/27/16 0717  WBC 5.5 6.4 8.2 8.3  NEUTROABS 3.0 4.4  --   --  HGB 12.5* 12.9* 13.3 12.1*  HCT 38.3* 39.6 40.3 37.2*  MCV 88.9 89.0 87.2 87.7  PLT 190 188 216 201   Basic Metabolic Panel:  Recent Labs Lab 06/22/16 0835 06/26/16 1243 06/26/16 1919 06/27/16 0717  NA 141 139  --  139  K 4.2 3.7  --  4.6  CL 98* 100*  --  101  CO2 35* 34*  --  31  GLUCOSE 137* 183*  --  243*  BUN 23* 20  --  23*  CREATININE 1.18 1.07 1.01 0.98  CALCIUM 8.9 8.7*  --  8.5*   GFR: Estimated Creatinine Clearance: 90.1 mL/min (by C-G formula based on SCr of 0.98 mg/dL). Liver Function  Tests:  Recent Labs Lab 06/27/16 0717  AST 24  ALT 43  ALKPHOS 55  BILITOT 0.5  PROT 6.9  ALBUMIN 3.6   No results for input(s): LIPASE, AMYLASE in the last 168 hours. No results for input(s): AMMONIA in the last 168 hours. Coagulation Profile: No results for input(s): INR, PROTIME in the last 168 hours. Cardiac Enzymes:  Recent Labs Lab 06/22/16 0835 06/26/16 1243  TROPONINI <0.03 <0.03   BNP (last 3 results) No results for input(s): PROBNP in the last 8760 hours. HbA1C:  Recent Labs  06/26/16 1919  HGBA1C 7.9*   CBG:  Recent Labs Lab 06/26/16 1843 06/26/16 2056 06/27/16 0733 06/27/16 1152  GLUCAP 239* 320* 237* 239*   Lipid Profile: No results for input(s): CHOL, HDL, LDLCALC, TRIG, CHOLHDL, LDLDIRECT in the last 72 hours. Thyroid Function Tests:  Recent Labs  06/26/16 1919  TSH 0.021*   Anemia Panel: No results for input(s): VITAMINB12, FOLATE, FERRITIN, TIBC, IRON, RETICCTPCT in the last 72 hours. Sepsis Labs: No results for input(s): PROCALCITON, LATICACIDVEN in the last 168 hours.  No results found for this or any previous visit (from the past 240 hour(s)).       Radiology Studies: Dg Chest 2 View  Result Date: 06/26/2016 CLINICAL DATA:  Shortness of breath. EXAM: CHEST  2 VIEW COMPARISON:  06/22/2016.  05/11/2015 . FINDINGS: Heart size normal. Mild right base pleuroparenchymal thickening again noted consistent with scarring. No acute pulmonary disease. No pleural effusion or pneumothorax . Thoracic spine scoliosis with prior fusion again noted. Deformity right ribs again noted. Surgical clips upper abdomen. IMPRESSION: No acute cardiopulmonary disease. Electronically Signed   By: Maisie Fushomas  Register   On: 06/26/2016 12:52   Portable Chest 1 View  Result Date: 06/27/2016 CLINICAL DATA:  COPD. EXAM: PORTABLE CHEST 1 VIEW COMPARISON:  06/26/2016. FINDINGS: Mediastinum hilar structures are normal. Heart size stable. No focal infiltrate. No pleural  effusion or pneumothorax. Stable deformity right ribs. Ask spine scoliosis Prior thoracic spine fusion . IMPRESSION: Stable chest.  No acute cardiopulmonary disease. Electronically Signed   By: Maisie Fushomas  Register   On: 06/27/2016 06:27        Scheduled Meds: . aspirin  325 mg Oral Daily  . azithromycin  250 mg Oral Daily  . enoxaparin (LOVENOX) injection  50 mg Subcutaneous Q24H  . fluticasone  1 spray Each Nare Daily  . guaiFENesin  600 mg Oral BID  . hydrALAZINE  25 mg Oral TID  . insulin aspart  0-15 Units Subcutaneous TID WC  . insulin aspart  0-5 Units Subcutaneous QHS  . ipratropium-albuterol  3 mL Nebulization QID  . l-methylfolate-B6-B12  1 tablet Oral Daily  . levothyroxine  125 mcg Oral QAC breakfast  . loratadine  10 mg Oral Daily  .  losartan  25 mg Oral Daily  . methylPREDNISolone (SOLU-MEDROL) injection  60 mg Intravenous Q6H  . mometasone-formoterol  2 puff Inhalation BID  . multivitamin with minerals  1 tablet Oral Daily  . pantoprazole  40 mg Oral Daily  . protein supplement shake  11 oz Oral BID BM  . rosuvastatin  40 mg Oral q1800  . sodium chloride flush  3 mL Intravenous Q12H   Continuous Infusions: . sodium chloride 75 mL/hr at 06/27/16 1106  . albuterol Stopped (06/26/16 1440)     LOS: 1 day        Glade Lloyd, MD [facility] Pager [phone]  If 7PM-7AM, please contact night-coverage [email or web address] Password [facility] 06/27/2016, 12:32 PM

## 2016-06-27 NOTE — Progress Notes (Signed)
Nutrition Brief Note  Received consult for assessment of COPD  Wt Readings from Last 15 Encounters:  06/27/16 229 lb 15 oz (104.3 kg)  06/22/16 225 lb (102.1 kg)  05/11/15 230 lb (104.3 kg)  09/18/11 234 lb 12.8 oz (106.5 kg)  09/12/11 236 lb 3.2 oz (107.1 kg)    Body mass index is 31.19 kg/m. Patient meets criteria for obese based on current BMI.   Current diet order is heart healthy/carbohydrate controlled, patient is consuming approximately 100% of meals at this time. Labs and medications reviewed.   Pt reports he is still hungry after meals. RD will order double protein with meals and Premier Protein BID, each supplement provides 160kcal and 30g protein.   Pt received heart healthy and diabetes nutrition education today.   No nutrition interventions warranted at this time. If nutrition issues arise, please consult RD.   Betsey Holidayasey Lillianne Eick MS, RD, LDN Pager #- [phone]

## 2016-06-27 NOTE — Care Management Note (Signed)
Case Management Note  Patient Details  Name: Richard Padilla MRN: [id number] Date of Birth: 1945/04/29  Subjective/Objective:                  71 y.o. male with medical history significant of HTN, HLD, COPD, Migraines, Sleep Apnea, Hypothyroidism and other comorbids who presents with worsening SOB over the last 2 weeks. Patient states he has been Coughing up sputum and has been white/yellow. He states he gets SOB lying down and SOB on Exertion and has felt weaker and has had more difficulty breathing. States he has had No Energy and denies any sick contacts. States he has been Wheezing significantly and feels congested. Patient denies any other complaints or concerns and denies CP, lightheadedness dizziness, urinary discomfort, however felt lightheaded on ambulation. Patient does admit to Constipation. [facility] was called [facility] COPD Exacerbation. Of note patient was seen in the ED twice with similar complaints (last visit was on Saturday 06/22/16) and was placed on a Prednisone taper as an outpatient with no real improvement.   ED Course: Had blood work done as well as a continuous Neb, Magnesium, Solumedrol, DuoNeb, and CXR. He was placed on Supplemental O2.   Action/Plan: Date: June 27, 2016 Chart reviewed for discharge orders: None found for case management. Clide Daleshonda Davis,BSN,RN3, [name]  Expected Discharge Date:   (unknown)               Expected Discharge Plan:  Home/Self Care  In-House Referral:     Discharge planning Services  CM Consult  Post Acute Care Choice:    Choice offered to:     DME Arranged:    DME Agency:     HH Arranged:    HH Agency:     Status of Service:  In process, will continue to follow  If discussed at Long Length of Stay Meetings, dates discussed:    Additional Comments:  Golda AcreDavis, Rhonda Lynn, RN 06/27/2016, 9:57 AM

## 2016-06-27 NOTE — Progress Notes (Signed)
Inpatient Diabetes Program Recommendations  AACE/ADA: New Consensus Statement on Inpatient Glycemic Control (2015)  Target Ranges:  Prepandial:   less than 140 mg/dL      Peak postprandial:   less than 180 mg/dL (1-2 hours)      Critically ill patients:  140 - 180 mg/dL   Lab Results  Component Value Date   GLUCAP 301 (H) 06/27/2016   HGBA1C 7.9 (H) 06/26/2016    Review of Glycemic Control  Diabetes history: DM2 Outpatient Diabetes medications: metformin 500 mg bid Current orders for Inpatient glycemic control: Novolog 0-15 units tidwc and hs  Needs increase in insulin while on steroids. Pt eating 100%.   Inpatient Diabetes Program Recommendations:    Add Lantus 12 units QHS Add Novolog 4 units tidwc for meal coverage insulin  Continue to follow.  Thank you. Ailene Ardshonda Alford Gamero, RD, LDN, CDE Inpatient Diabetes Coordinator [phone]

## 2016-06-28 LAB — BASIC METABOLIC PANEL
Anion gap: 5 (ref 5–15)
BUN: 26 mg/dL — AB (ref 6–20)
CALCIUM: 8.7 mg/dL — AB (ref 8.9–10.3)
CO2: 31 mmol/L (ref 22–32)
Chloride: 101 mmol/L (ref 101–111)
Creatinine, Ser: 0.99 mg/dL (ref 0.61–1.24)
GFR calc Af Amer: >60 mL/min (ref >60)
GLUCOSE: 254 mg/dL — AB (ref 65–99)
Potassium: 4.4 mmol/L (ref 3.5–5.1)
Sodium: 137 mmol/L (ref 135–145)

## 2016-06-28 LAB — CBC WITH DIFFERENTIAL/PLATELET
Basophils Absolute: 0 10*3/uL (ref 0.0–0.1)
Basophils Relative: 0 %
EOS PCT: 0 %
Eosinophils Absolute: 0 10*3/uL (ref 0.0–0.7)
HEMATOCRIT: 38.5 % — AB (ref 39.0–52.0)
Hemoglobin: 12.4 g/dL — ABNORMAL LOW (ref 13.0–17.0)
LYMPHS PCT: 7 %
Lymphs Abs: 0.9 10*3/uL (ref 0.7–4.0)
MCH: 27.6 pg (ref 26.0–34.0)
MCHC: 32.2 g/dL (ref 30.0–36.0)
MCV: 85.6 fL (ref 78.0–100.0)
MONO ABS: 0.7 10*3/uL (ref 0.1–1.0)
MONOS PCT: 5 %
NEUTROS ABS: 11.9 10*3/uL — AB (ref 1.7–7.7)
Neutrophils Relative %: 88 %
PLATELETS: 207 10*3/uL (ref 150–400)
RBC: 4.5 MIL/uL (ref 4.22–5.81)
RDW: 14.3 % (ref 11.5–15.5)
WBC: 13.5 10*3/uL — ABNORMAL HIGH (ref 4.0–10.5)

## 2016-06-28 LAB — GLUCOSE, CAPILLARY
GLUCOSE-CAPILLARY: 215 mg/dL — AB (ref 65–99)
GLUCOSE-CAPILLARY: 266 mg/dL — AB (ref 65–99)
GLUCOSE-CAPILLARY: 239 mg/dL — AB (ref 65–99)
Glucose-Capillary: 318 mg/dL — ABNORMAL HIGH (ref 65–99)

## 2016-06-28 LAB — MAGNESIUM: Magnesium: 2.4 mg/dL (ref 1.7–2.4)

## 2016-06-28 MED ORDER — METHYLPREDNISOLONE SODIUM SUCC 40 MG IJ SOLR
40.0000 mg | Freq: Three times a day (TID) | INTRAMUSCULAR | Status: DC
  Administered 2016-06-28 – 2016-06-29 (×4): 40 mg via INTRAVENOUS
  Filled 2016-06-28 (×4): qty 1

## 2016-06-28 MED ORDER — METFORMIN HCL 500 MG PO TABS
1000.0000 mg | ORAL_TABLET | Freq: Two times a day (BID) | ORAL | Status: DC
  Administered 2016-06-28 – 2016-06-29 (×2): 1000 mg via ORAL
  Filled 2016-06-28 (×2): qty 2

## 2016-06-28 MED ORDER — PSYLLIUM 95 % PO PACK
1.0000 | PACK | Freq: Two times a day (BID) | ORAL | Status: DC
  Administered 2016-06-28 – 2016-06-29 (×3): 1 via ORAL
  Filled 2016-06-28 (×3): qty 1

## 2016-06-28 NOTE — Progress Notes (Signed)
OT Cancellation Note  Patient Details Name: Eben Burowathaniel Sealey MRN: [id number] DOB: 08/02/1945   Cancelled Treatment:    Reason Eval/Treat Not Completed: Other (comment).  Checked in with pt.  He understands energy conservation that Samara DeistKathryn, OT discussed with him yesterday.  He reports that he can stand at the sink for grooming and feels comfortable with ADLs taking rest breaks. Will sign off.   Hien Perreira 06/28/2016, 3:55 PM\ Marica OtterMaryellen Avaree Gilberti, OTR/L [phone] 06/28/2016

## 2016-06-28 NOTE — Progress Notes (Signed)
Pt. Placed on BiPAP for h/s, oxygen added to circuit at 1 lpm, tolerating well, RT to monitor.

## 2016-06-28 NOTE — Progress Notes (Addendum)
Patient ID: Richard Padilla, male   DOB: 22-Aug-1945, 71 y.o.   MRN: [id number]  PROGRESS NOTE    Janiel Crisostomo  WUJ:[id number] DOB: [birth year] DOA: 06/26/2016 PCP: System, Pcp Not In   Brief Narrative:  71 year old male with history of hypertension, hyperlipidemia, COPD, migraines, sleep apnea, hypothyroidism presented with worsening shortness of breath over the last 2 weeks. He was admitted for diagnosis of probable COPD exacerbation on intravenous steroids.  Assessment & Plan:   Principal Problem:   COPD exacerbation (HCC) Active Problems:   HTN (hypertension)   Hypothyroidism   Hyperlipidemia   OSA on CPAP   Constipation   Hyperglycemia   COPD (chronic obstructive pulmonary disease) (HCC)   Acute respiratory failure with hypoxia (HCC)   Leg swelling   Hypoxia likely 2/2 to COPD Exacerbation - continue oxygen supplementation. If hypoxia worsens, then will get CT chest.   Acute COPD Exacerbation - Decreased Solumedrol to 40 mg IV every 8. Continue oral Zithromax. Continue with DuoNebs and Dulera  -C/w Supplemental O2 - Patient might benefit from outpatient pulmonary evaluation.  HTN -Monitor blood pressure. Continue losartan. Hold propranolol for now because of intermittent bradycardia -C/w Hydralazine po 25 mg TID   Diabetes Mellitus Type 2 uncontrolled with hyperglycemia -Resume metformin  -Place on Moderate Intensity Novolog SSI AC -Continue to Monitor CBG's -Hemoglobin A1c is 7.9 - Blood sugars will probably improve with decreasing the doses of steroids.  OSA on Home BiPAP -C/w Home BiPAP   HLD -C/w Home Rosuvastatin 40 mg po Daily  Hypothyroidism -Continue levothyroxine  Lower Extremity Swelling/Mild Edema -Questionable cause. Outpatient follow-up  Obesity Follow up dietary recommendations. Outpatient follow-up  DVT prophylaxis:Enoxaparin Code Status:FULL CODE Family Communication: None present at bedside Disposition Plan:Home in 1-2  days Consults called:None   Procedures: None  Antimicrobials: Zithromax from 06/26/2016. Patient received 1 dose of Rocephin on 06/26/2016  Subjective: Patient seen and examined at bedside. He denies any overnight fever, nausea, vomiting. His cough and shortness of breath are improving.  Objective: Vitals:   06/27/16 2113 06/28/16 0444 06/28/16 0802 06/28/16 1230  BP:  (!) 153/63    Pulse:  60    Resp:  20    Temp:  98.2 F (36.8 C)    TempSrc:  Tympanic    SpO2: 96% 94% 97% 91%  Weight:  105 kg (231 lb 7.7 oz)    Height:        Intake/Output Summary (Last 24 hours) at 06/28/16 1313 Last data filed at 06/28/16 0918  Gross per 24 hour  Intake             1160 ml  Output              625 ml  Net              535 ml   Filed Weights   06/26/16 1704 06/27/16 0505 06/28/16 0444  Weight: 104.3 kg (230 lb) 104.3 kg (229 lb 15 oz) 105 kg (231 lb 7.7 oz)    Examination:  General exam: Appears calm and comfortable  Respiratory system: Bilateral decreased breath sound at bases With scattered wheezing Cardiovascular system: S1 & S2 heard, rate controlled  Gastrointestinal system: Abdomen is nondistended, soft and nontender. Normal bowel sounds heard. Extremities: No cyanosis, clubbing; mild pitting pedal edema      Data Reviewed: I have personally reviewed following labs and imaging studies  CBC:  Recent Labs Lab 06/22/16 0835 06/26/16 1243 06/26/16 1919 06/27/16 9562 06/28/16 1308  WBC 5.5 6.4 8.2 8.3 13.5*  NEUTROABS 3.0 4.4  --   --  11.9*  HGB 12.5* 12.9* 13.3 12.1* 12.4*  HCT 38.3* 39.6 40.3 37.2* 38.5*  MCV 88.9 89.0 87.2 87.7 85.6  PLT 190 188 216 201 207   Basic Metabolic Panel:  Recent Labs Lab 06/22/16 0835 06/26/16 1243 06/26/16 1919 06/27/16 0717 06/28/16 0712  NA 141 139  --  139 137  K 4.2 3.7  --  4.6 4.4  CL 98* 100*  --  101 101  CO2 35* 34*  --  31 31  GLUCOSE 137* 183*  --  243* 254*  BUN 23* 20  --  23* 26*  CREATININE 1.18 1.07  1.01 0.98 0.99  CALCIUM 8.9 8.7*  --  8.5* 8.7*  MG  --   --   --   --  2.4   GFR: Estimated Creatinine Clearance: 89.5 mL/min (by C-G formula based on SCr of 0.99 mg/dL). Liver Function Tests:  Recent Labs Lab 06/27/16 0717  AST 24  ALT 43  ALKPHOS 55  BILITOT 0.5  PROT 6.9  ALBUMIN 3.6   No results for input(s): LIPASE, AMYLASE in the last 168 hours. No results for input(s): AMMONIA in the last 168 hours. Coagulation Profile: No results for input(s): INR, PROTIME in the last 168 hours. Cardiac Enzymes:  Recent Labs Lab 06/22/16 0835 06/26/16 1243  TROPONINI <0.03 <0.03   BNP (last 3 results) No results for input(s): PROBNP in the last 8760 hours. HbA1C:  Recent Labs  06/26/16 1919  HGBA1C 7.9*   CBG:  Recent Labs Lab 06/27/16 1152 06/27/16 1714 06/27/16 2208 06/28/16 0752 06/28/16 1221  GLUCAP 239* 301* 283* 215* 318*   Lipid Profile: No results for input(s): CHOL, HDL, LDLCALC, TRIG, CHOLHDL, LDLDIRECT in the last 72 hours. Thyroid Function Tests:  Recent Labs  06/26/16 1919  TSH 0.021*   Anemia Panel: No results for input(s): VITAMINB12, FOLATE, FERRITIN, TIBC, IRON, RETICCTPCT in the last 72 hours. Sepsis Labs: No results for input(s): PROCALCITON, LATICACIDVEN in the last 168 hours.  No results found for this or any previous visit (from the past 240 hour(s)).       Radiology Studies: Portable Chest 1 View  Result Date: 06/27/2016 CLINICAL DATA:  COPD. EXAM: PORTABLE CHEST 1 VIEW COMPARISON:  06/26/2016. FINDINGS: Mediastinum hilar structures are normal. Heart size stable. No focal infiltrate. No pleural effusion or pneumothorax. Stable deformity right ribs. Ask spine scoliosis Prior thoracic spine fusion . IMPRESSION: Stable chest.  No acute cardiopulmonary disease. Electronically Signed   By: Maisie Fushomas  Register   On: 06/27/2016 06:27        Scheduled Meds: . aspirin  325 mg Oral Daily  . azithromycin  250 mg Oral Daily  .  docusate sodium  100 mg Oral BID  . enoxaparin (LOVENOX) injection  50 mg Subcutaneous Q24H  . fluticasone  1 spray Each Nare Daily  . guaiFENesin  600 mg Oral BID  . hydrALAZINE  25 mg Oral TID  . insulin aspart  0-15 Units Subcutaneous TID WC  . insulin aspart  0-5 Units Subcutaneous QHS  . ipratropium-albuterol  3 mL Nebulization QID  . l-methylfolate-B6-B12  1 tablet Oral Daily  . levothyroxine  125 mcg Oral QAC breakfast  . loratadine  10 mg Oral Daily  . losartan  25 mg Oral Daily  . methylPREDNISolone (SOLU-MEDROL) injection  40 mg Intravenous Q8H  . mometasone-formoterol  2 puff Inhalation BID  . multivitamin  with minerals  1 tablet Oral Daily  . pantoprazole  40 mg Oral Daily  . polyethylene glycol  17 g Oral Daily  . protein supplement shake  11 oz Oral BID BM  . psyllium  1 packet Oral BID  . rosuvastatin  40 mg Oral q1800  . sodium chloride flush  3 mL Intravenous Q12H   Continuous Infusions:   LOS: 2 days        Glade Lloyd, MD [facility] Pager [phone]  If 7PM-7AM, please contact night-coverage [email or web address] Password [email or web address] 06/28/2016, 1:13 PM

## 2016-06-29 LAB — CBC WITH DIFFERENTIAL/PLATELET
BASOS ABS: 0 10*3/uL (ref 0.0–0.1)
BASOS PCT: 0 %
EOS ABS: 0 10*3/uL (ref 0.0–0.7)
EOS PCT: 0 %
HCT: 38.1 % — ABNORMAL LOW (ref 39.0–52.0)
Hemoglobin: 12.6 g/dL — ABNORMAL LOW (ref 13.0–17.0)
Lymphocytes Relative: 9 %
Lymphs Abs: 1.4 10*3/uL (ref 0.7–4.0)
MCH: 28.4 pg (ref 26.0–34.0)
MCHC: 33.1 g/dL (ref 30.0–36.0)
MCV: 86 fL (ref 78.0–100.0)
MONO ABS: 1.1 10*3/uL — AB (ref 0.1–1.0)
Monocytes Relative: 7 %
Neutro Abs: 13.5 10*3/uL — ABNORMAL HIGH (ref 1.7–7.7)
Neutrophils Relative %: 84 %
PLATELETS: 204 10*3/uL (ref 150–400)
RBC: 4.43 MIL/uL (ref 4.22–5.81)
RDW: 14.5 % (ref 11.5–15.5)
WBC: 16 10*3/uL — ABNORMAL HIGH (ref 4.0–10.5)

## 2016-06-29 LAB — BASIC METABOLIC PANEL
ANION GAP: 6 (ref 5–15)
BUN: 29 mg/dL — ABNORMAL HIGH (ref 6–20)
CALCIUM: 8.6 mg/dL — AB (ref 8.9–10.3)
CO2: 30 mmol/L (ref 22–32)
Chloride: 101 mmol/L (ref 101–111)
Creatinine, Ser: 1.01 mg/dL (ref 0.61–1.24)
GLUCOSE: 220 mg/dL — AB (ref 65–99)
Potassium: 4.3 mmol/L (ref 3.5–5.1)
Sodium: 137 mmol/L (ref 135–145)

## 2016-06-29 LAB — HEMOGLOBIN A1C
HEMOGLOBIN A1C: 8.6 % — AB (ref 4.8–5.6)
Mean Plasma Glucose: 200 mg/dL

## 2016-06-29 LAB — GLUCOSE, CAPILLARY
Glucose-Capillary: 208 mg/dL — ABNORMAL HIGH (ref 65–99)
Glucose-Capillary: 176 mg/dL — ABNORMAL HIGH (ref 65–99)

## 2016-06-29 LAB — MAGNESIUM: Magnesium: 2.3 mg/dL (ref 1.7–2.4)

## 2016-06-29 MED ORDER — PREDNISONE 20 MG PO TABS: 40.0000 mg | ORAL_TABLET | Freq: Every day | ORAL | 0 refills | Status: AC

## 2016-06-29 MED ORDER — ALBUTEROL SULFATE HFA 108 (90 BASE) MCG/ACT IN AERS: 2.0000 | INHALATION_SPRAY | Freq: Four times a day (QID) | RESPIRATORY_TRACT | 1 refills | Status: AC | PRN

## 2016-06-29 MED ORDER — POLYETHYLENE GLYCOL 3350 17 G PO PACK: 17.0000 g | PACK | Freq: Every day | ORAL | 0 refills | Status: AC

## 2016-06-29 MED ORDER — LOSARTAN POTASSIUM 25 MG PO TABS: 50.0000 mg | ORAL_TABLET | Freq: Every day | ORAL | 0 refills | Status: DC

## 2016-06-29 MED ORDER — ALBUTEROL SULFATE (2.5 MG/3ML) 0.083% IN NEBU: 2.5000 mg | INHALATION_SOLUTION | Freq: Four times a day (QID) | RESPIRATORY_TRACT | 0 refills | Status: DC | PRN

## 2016-06-29 NOTE — Discharge Instructions (Signed)
Chronic Obstructive Pulmonary Disease Chronic obstructive pulmonary disease (COPD) is a common lung condition in which airflow from the lungs is limited. COPD is a general term that can be used to describe many different lung problems that limit airflow, including both chronic bronchitis and emphysema. If you have COPD, your lung function will probably never return to normal, but there are measures you can take to improve lung function and make yourself feel better. What are the causes?  Smoking (common).  Exposure to secondhand smoke.  Genetic problems.  Chronic inflammatory lung diseases or recurrent infections. What are the signs or symptoms?  Shortness of breath, especially with physical activity.  Deep, persistent (chronic) cough with a large amount of thick mucus.  Wheezing.  Rapid breaths (tachypnea).  Gray or bluish discoloration (cyanosis) of the skin, especially in your fingers, toes, or lips.  Fatigue.  Weight loss.  Frequent infections or episodes when breathing symptoms become much worse (exacerbations).  Chest tightness. How is this diagnosed? Your health care provider will take a medical history and perform a physical examination to diagnose COPD. Additional tests for COPD may include:  Lung (pulmonary) function tests.  Chest X-ray.  CT scan.  Blood tests. How is this treated? Treatment for COPD may include:  Inhaler and nebulizer medicines. These help manage the symptoms of COPD and make your breathing more comfortable.  Supplemental oxygen. Supplemental oxygen is only helpful if you have a low oxygen level in your blood.  Exercise and physical activity. These are beneficial for nearly all people with COPD.  Lung surgery or transplant.  Nutrition therapy to gain weight, if you are underweight.  Pulmonary rehabilitation. This may involve working with a team of health care providers and specialists, such as respiratory, occupational, and physical  therapists. Follow these instructions at home:  Take all medicines (inhaled or pills) as directed by your health care provider.  Avoid over-the-counter medicines or cough syrups that dry up your airway (such as antihistamines) and slow down the elimination of secretions unless instructed otherwise by your health care provider.  If you are a smoker, the most important thing that you can do is stop smoking. Continuing to smoke will cause further lung damage and breathing trouble. Ask your health care provider for help with quitting smoking. He or she can direct you to community resources or hospitals that provide support.  Avoid exposure to irritants such as smoke, chemicals, and fumes that aggravate your breathing.  Use oxygen therapy and pulmonary rehabilitation if directed by your health care provider. If you require home oxygen therapy, ask your health care provider whether you should purchase a pulse oximeter to measure your oxygen level at home.  Avoid contact with individuals who have a contagious illness.  Avoid extreme temperature and humidity changes.  Eat healthy foods. Eating smaller, more frequent meals and resting before meals may help you maintain your strength.  Stay active, but balance activity with periods of rest. Exercise and physical activity will help you maintain your ability to do things you want to do.  Preventing infection and hospitalization is very important when you have COPD. Make sure to receive all the vaccines your health care provider recommends, especially the pneumococcal and influenza vaccines. Ask your health care provider whether you need a pneumonia vaccine.  Learn and use relaxation techniques to manage stress.  Learn and use controlled breathing techniques as directed by your health care provider. Controlled breathing techniques include: 1. Pursed lip breathing. Start by breathing in (inhaling)   through your nose for 1 second. Then, purse your lips as  if you were going to whistle and breathe out (exhale) through the pursed lips for 2 seconds. 2. Diaphragmatic breathing. Start by putting one hand on your abdomen just above your waist. Inhale slowly through your nose. The hand on your abdomen should move out. Then purse your lips and exhale slowly. You should be able to feel the hand on your abdomen moving in as you exhale.  Learn and use controlled coughing to clear mucus from your lungs. Controlled coughing is a series of short, progressive coughs. The steps of controlled coughing are: 1. Lean your head slightly forward. 2. Breathe in deeply using diaphragmatic breathing. 3. Try to hold your breath for 3 seconds. 4. Keep your mouth slightly open while coughing twice. 5. Spit any mucus out into a tissue. 6. Rest and repeat the steps once or twice as needed. Contact a health care provider if:  You are coughing up more mucus than usual.  There is a change in the color or thickness of your mucus.  Your breathing is more labored than usual.  Your breathing is faster than usual. Get help right away if:  You have shortness of breath while you are resting.  You have shortness of breath that prevents you from:  Being able to talk.  Performing your usual physical activities.  You have chest pain lasting longer than 5 minutes.  Your skin color is more cyanotic than usual.  You measure low oxygen saturations for longer than 5 minutes with a pulse oximeter. This information is not intended to replace advice given to you by your health care provider. Make sure you discuss any questions you have with your health care provider. Document Released: 10/03/2004 Document Revised: 06/01/2015 Document Reviewed: 08/20/2012 Elsevier Interactive Patient Education  2017 [employer] Inc.  

## 2016-06-29 NOTE — Care Management Important Message (Signed)
Important Message  Patient Details  Name: Richard Padilla MRN: [id number] Date of Birth: [birth year]   Medicare Important Message Given:  Yes    Elliot CousinShavis, Garcia Dalzell Ellen, RN 06/29/2016, 10:55 AM

## 2016-06-29 NOTE — Care Management Note (Addendum)
Case Management Note  Patient Details  Name: Richard Padilla MRN: [id number] Date of Birth: [birth year]  Subjective/Objective:    COPD exac,  HTN, Hypothyroidism               Action/Plan: Discharge Planning: NCM spoke to pt and states he lives in [place] and will be here until 07/16/2016 visiting. States he does not have oxygen or neb machine at home. He does have the Bipap he is using during his travels. Pt [facility] and sees Dr Steva ReadySandhu # [phone]. Gave permission to fax dc summary and H[facility] and [facility]. Contacted [employer] for oxygen and neb machine for home. Portable was delivered to room. Provided pt with [facility] and [facility]. He will call on Monday to arrange hospital follow up appt.   Estelle JuneGirlfriend's, Margaret Jones # [phone] address is [address], [place]     Expected Discharge Date:  06/29/16               Expected Discharge Plan:  Home/Self Care  In-House Referral:  NA  Discharge planning Services  CM Consult  Post Acute Care Choice:  NA Choice offered to:  NA  DME Arranged:  Nebulizer machine DME Agency:  [facility]  HH Arranged:  NA HH Agency:  NA  Status of Service:  Completed, signed off  If discussed at Long Length of Stay Meetings, dates discussed:    Additional Comments:  Elliot CousinShavis, Monic Engelmann Ellen, RN 06/29/2016, 11:05 AM

## 2016-06-29 NOTE — Progress Notes (Signed)
SATURATION QUALIFICATIONS: (This note is used to comply with regulatory documentation for home oxygen)  Patient Saturations on Room Air at Rest =94%  Patient Saturations on Room Air while Ambulating = 87%  Patient Saturations on 2 Liters of oxygen while Ambulating = 97%  Please briefly explain why patient needs home oxygen:  unable to maintain oxygen level greater than 92% on room m air while ambulating.

## 2016-06-29 NOTE — Progress Notes (Signed)
Discharge instructions and prescriptions reviewed with patient using teach back method. Patient discharged to home.

## 2016-06-29 NOTE — Discharge Summary (Addendum)
Physician Discharge Summary  Richard Padilla ZOX:[id number] DOB: 01-19-1945 DOA: 06/26/2016  PCP: System, Pcp Not In  Admit date: 06/26/2016 Discharge date: 06/29/2016  Admitted From: Home  Disposition:  Home  Recommendations for Outpatient Follow-up:  1. Follow up with PCP in 1-2 weeks 2. Patient might need outpatient pulmonary evaluation   Home Health: No  Equipment/Devices: Home oxygen   Discharge Condition: Stable  CODE STATUS: Full  Diet recommendation: Heart Healthy / Carb Modified Brief/Interim Summary: 71 year old male with history of hypertension, hyperlipidemia, COPD, migraines, sleep apnea, hypothyroidism presented with worsening shortness of breath over the last 2 weeks. He was admitted for diagnosis of probable COPD exacerbation on intravenous steroids and required oxygen supplementation.  Discharge Diagnoses:  Principal Problem:   COPD exacerbation (HCC) Active Problems:   HTN (hypertension)   Hypothyroidism   Hyperlipidemia   OSA on CPAP   Constipation   Hyperglycemia   COPD (chronic obstructive pulmonary disease) (HCC)   Acute respiratory failure with hypoxia (HCC)   Leg swelling  Hypoxia likely 2/2 to COPD Exacerbation - Resolved. Currently in room air  Acute COPD Exacerbation - Status post intravenous Solumedrol  and oral Zithromax.  - Initially required oxygen via nasal cannula at 2 L/m. This will be continued on discharged as his saturation dropped to the 80's on ambulation. He feels much better. Discharge the patient on oral prednisone 40 mg by mouth daily for 7 days. Continue Symbicort, nebs and as needed albuterol  - Patient might benefit from outpatient pulmonary evaluation.  HTN -Discharged home on losartan 50 mg daily. Hold propranolol for now because of intermittent bradycardia evaluated by primary care provider -C/w Hydralazine. Discontinue lisinopril  Diabetes Mellitus Type 2 uncontrolled with hyperglycemia -Resume metformin   -Hemoglobin A1c is 7.9 - Outpatient follow-up  OSA on Home BiPAP -C/w Home BiPAP   HLD -C/w Home Rosuvastatin 40 mg po Daily  Hypothyroidism -Continue levothyroxine  Lower Extremity Swelling/Mild Edema -Questionable cause. Outpatient follow-up  Obesity Follow up dietary recommendations. Outpatient follow-up  Leukocytosis Probably reactive   Discharge Instructions  Discharge Instructions    Call MD for:  difficulty breathing, headache or visual disturbances    Complete by:  As directed    Call MD for:  hives    Complete by:  As directed    Call MD for:  persistant dizziness or light-headedness    Complete by:  As directed    Call MD for:  persistant nausea and vomiting    Complete by:  As directed    Call MD for:  severe uncontrolled pain    Complete by:  As directed    Call MD for:  temperature >100.4    Complete by:  As directed    Diet - low sodium heart healthy    Complete by:  As directed    Diet Carb Modified    Complete by:  As directed    Increase activity slowly    Complete by:  As directed      Allergies as of 06/29/2016   No Known Allergies     Medication List    STOP taking these medications   lisinopril 2.5 MG tablet Commonly known as:  PRINIVIL,ZESTRIL   propranolol ER 120 MG 24 hr capsule Commonly known as:  INDERAL LA     TAKE these medications   acetaminophen 500 MG tablet Commonly known as:  TYLENOL Take 1,000 mg by mouth every 6 (six) hours as needed for mild pain, moderate pain, fever or headache.  albuterol 108 (90 Base) MCG/ACT inhaler Commonly known as:  PROVENTIL HFA;VENTOLIN HFA Inhale 2 puffs into the lungs every 6 (six) hours as needed for wheezing or shortness of breath. What changed:  how much to take   albuterol (2.5 MG/3ML) 0.083% nebulizer solution Commonly known as:  PROVENTIL Take 3 mLs (2.5 mg total) by nebulization every 6 (six) hours as needed for wheezing. What changed:  You were already taking a  medication with the same name, and this prescription was added. Make sure you understand how and when to take each.   ALIVE MENS ENERGY Tabs Take 1 tablet by mouth daily.   aspirin EC 325 MG tablet Take 325 mg by mouth daily.   budesonide-formoterol 160-4.5 MCG/ACT inhaler Commonly known as:  SYMBICORT Inhale 2 puffs into the lungs 2 (two) times daily.   hydrALAZINE 25 MG tablet Commonly known as:  APRESOLINE Take 25 mg by mouth at bedtime.   l-methylfolate-B6-B12 3-35-2 MG Tabs tablet Commonly known as:  METANX Take 1 tablet by mouth daily.   levothyroxine 125 MCG tablet Commonly known as:  SYNTHROID, LEVOTHROID Take 125 mcg by mouth daily before breakfast.   loratadine 10 MG tablet Commonly known as:  CLARITIN Take 10 mg by mouth daily as needed for allergies.   losartan 25 MG tablet Commonly known as:  COZAAR Take 2 tablets (50 mg total) by mouth daily. What changed:  how much to take   meloxicam 15 MG tablet Commonly known as:  MOBIC Take 7.5-15 mg by mouth daily as needed for pain.   metFORMIN 500 MG tablet Commonly known as:  GLUCOPHAGE Take 500 mg by mouth 2 (two) times daily.   omeprazole 20 MG capsule Commonly known as:  PRILOSEC TAKE ONE CAPSULE EVERY DAY   polyethylene glycol packet Commonly known as:  MIRALAX / GLYCOLAX Take 17 g by mouth daily.   predniSONE 20 MG tablet Commonly known as:  DELTASONE Take 2 tablets (40 mg total) by mouth daily.   rosuvastatin 40 MG tablet Commonly known as:  CRESTOR Take 40 mg by mouth at bedtime.            Durable Medical Equipment        Start     Ordered   06/29/16 1126  For home use only DME oxygen  Once    Comments:  Evaluate for light weight portable  Question Answer Comment  Mode or (Route) Nasal cannula   Liters per Minute 2   Frequency Continuous (stationary and portable oxygen unit needed)   Oxygen conserving device Yes   Oxygen delivery system Gas      06/29/16 1125   06/29/16 1125   For home use only DME Nebulizer/meds  Once    Question:  Patient needs a nebulizer to treat with the following condition  Answer:  COPD exacerbation (HCC)   06/29/16 1124   06/29/16 1058  For home use only DME Nebulizer machine  Once    Question:  Patient needs a nebulizer to treat with the following condition  Answer:  COPD (chronic obstructive pulmonary disease) (HCC)   06/29/16 1057       No Known Allergies  Consultations:  None   Procedures/Studies: Dg Chest 2 View  Result Date: 06/26/2016 CLINICAL DATA:  Shortness of breath. EXAM: CHEST  2 VIEW COMPARISON:  06/22/2016.  05/11/2015 . FINDINGS: Heart size normal. Mild right base pleuroparenchymal thickening again noted consistent with scarring. No acute pulmonary disease. No pleural effusion or pneumothorax .  Thoracic spine scoliosis with prior fusion again noted. Deformity right ribs again noted. Surgical clips upper abdomen. IMPRESSION: No acute cardiopulmonary disease. Electronically Signed   By: Maisie Fus  Register   On: 06/26/2016 12:52   Dg Chest 2 View  Result Date: 06/22/2016 CLINICAL DATA:  Cough and congestion.  Wheezing. EXAM: CHEST  2 VIEW COMPARISON:  05/11/2015 FINDINGS: The heart size and mediastinal contours are within normal limits. Both lungs are clear. Chronic appearing right posterior rib deformities. Unchanged. Rightward curvature of the thoracic spine. Scoliosis rods are identified. IMPRESSION: 1. No acute cardiopulmonary abnormalities. Electronically Signed   By: Signa Kell M.D.   On: 06/22/2016 09:12   Portable Chest 1 View  Result Date: 06/27/2016 CLINICAL DATA:  COPD. EXAM: PORTABLE CHEST 1 VIEW COMPARISON:  06/26/2016. FINDINGS: Mediastinum hilar structures are normal. Heart size stable. No focal infiltrate. No pleural effusion or pneumothorax. Stable deformity right ribs. Ask spine scoliosis Prior thoracic spine fusion . IMPRESSION: Stable chest.  No acute cardiopulmonary disease. Electronically Signed    By: Maisie Fus  Register   On: 06/27/2016 06:27    Echo on 06/27/2016: Study Conclusions  - Left ventricle: The cavity size was normal. There was mild   concentric hypertrophy. Systolic function was normal. The   estimated ejection fraction was in the range of 60% to 65%. Wall   motion was normal; there were no regional wall motion   abnormalities. Features are consistent with a pseudonormal left   ventricular filling pattern, with concomitant abnormal relaxation   and increased filling pressure (grade 2 diastolic dysfunction). - Right atrium: The atrium was mildly dilated.   Subjective: Patient seen and examined at bedside. He feels much better although still coughing and slightly short of breath with exertion. No overnight fever, chest pain, nausea, vomiting.  Discharge Exam: Vitals:   06/28/16 2057 06/29/16 0553  BP: (!) 143/88 (!) 157/83  Pulse: 76 (!) 58  Resp: 20 20  Temp: 97.7 F (36.5 C) 97.5 F (36.4 C)   Vitals:   06/28/16 1543 06/28/16 2034 06/28/16 2057 06/29/16 0553  BP:   (!) 143/88 (!) 157/83  Pulse:   76 (!) 58  Resp:   20 20  Temp:   97.7 F (36.5 C) 97.5 F (36.4 C)  TempSrc:   Oral Oral  SpO2: 94% 93% 100% 95%  Weight:      Height:        General: Pt is alert, awake, not in acute distress Cardiovascular: Intermittent bradycardia, S1/S2 +,  Respiratory: Bilateral decreased breath sounds at bases with some wheezing Abdominal: Soft, NT, ND, bowel sounds + Extremities: No clubbing, no cyanosis. Trace pitting pedal edema    The results of significant diagnostics from this hospitalization (including imaging, microbiology, ancillary and laboratory) are listed below for reference.     Microbiology: No results found for this or any previous visit (from the past 240 hour(s)).   Labs: BNP (last 3 results)  Recent Labs  06/26/16 1243  BNP 96.6   Basic Metabolic Panel:  Recent Labs Lab 06/26/16 1243 06/26/16 1919 06/27/16 0717 06/28/16 0712  06/29/16 0641  NA 139  --  139 137 137  K 3.7  --  4.6 4.4 4.3  CL 100*  --  101 101 101  CO2 34*  --  31 31 30   GLUCOSE 183*  --  243* 254* 220*  BUN 20  --  23* 26* 29*  CREATININE 1.07 1.01 0.98 0.99 1.01  CALCIUM 8.7*  --  8.5* 8.7* 8.6*  MG  --   --   --  2.4 2.3   Liver Function Tests:  Recent Labs Lab 06/27/16 0717  AST 24  ALT 43  ALKPHOS 55  BILITOT 0.5  PROT 6.9  ALBUMIN 3.6   No results for input(s): LIPASE, AMYLASE in the last 168 hours. No results for input(s): AMMONIA in the last 168 hours. CBC:  Recent Labs Lab 06/26/16 1243 06/26/16 1919 06/27/16 0717 06/28/16 0712 06/29/16 0641  WBC 6.4 8.2 8.3 13.5* 16.0*  NEUTROABS 4.4  --   --  11.9* 13.5*  HGB 12.9* 13.3 12.1* 12.4* 12.6*  HCT 39.6 40.3 37.2* 38.5* 38.1*  MCV 89.0 87.2 87.7 85.6 86.0  PLT 188 216 201 207 204   Cardiac Enzymes:  Recent Labs Lab 06/26/16 1243  TROPONINI <0.03   BNP: Invalid input(s): POCBNP CBG:  Recent Labs Lab 06/28/16 0752 06/28/16 1221 06/28/16 1725 06/28/16 2050 06/29/16 0744  GLUCAP 215* 318* 266* 239* 208*   D-Dimer No results for input(s): DDIMER in the last 72 hours. Hgb A1c  Recent Labs  06/26/16 1919 06/28/16 0712  HGBA1C 7.9* 8.6*   Lipid Profile No results for input(s): CHOL, HDL, LDLCALC, TRIG, CHOLHDL, LDLDIRECT in the last 72 hours. Thyroid function studies  Recent Labs  06/26/16 1919  TSH 0.021*   Anemia work up No results for input(s): VITAMINB12, FOLATE, FERRITIN, TIBC, IRON, RETICCTPCT in the last 72 hours. Urinalysis No results found for: COLORURINE, APPEARANCEUR, LABSPEC, PHURINE, GLUCOSEU, HGBUR, BILIRUBINUR, KETONESUR, PROTEINUR, UROBILINOGEN, NITRITE, LEUKOCYTESUR Sepsis Labs Invalid input(s): PROCALCITONIN,  WBC,  LACTICIDVEN Microbiology No results found for this or any previous visit (from the past 240 hour(s)).   Time coordinating discharge: 35 minutes  SIGNED:   Glade LloydKshitiz Roshad Hack, MD  [facility] 06/29/2016, 9:57 AM Pager: [phone]  If 7PM-7AM, please contact night-coverage [email or web address] Password [email or web address]

## 2016-06-30 LAB — RESPIRATORY PANEL BY PCR
ADENOVIRUS-RVPPCR: NOT DETECTED
Bordetella pertussis: NOT DETECTED
CORONAVIRUS 229E-RVPPCR: NOT DETECTED
CORONAVIRUS HKU1-RVPPCR: NOT DETECTED
CORONAVIRUS OC43-RVPPCR: NOT DETECTED
Chlamydophila pneumoniae: NOT DETECTED
Coronavirus NL63: NOT DETECTED
INFLUENZA B-RVPPCR: NOT DETECTED
Influenza A: NOT DETECTED
Metapneumovirus: DETECTED — AB
Mycoplasma pneumoniae: NOT DETECTED
PARAINFLUENZA VIRUS 1-RVPPCR: NOT DETECTED
Parainfluenza Virus 2: NOT DETECTED
Parainfluenza Virus 3: NOT DETECTED
Parainfluenza Virus 4: NOT DETECTED
RESPIRATORY SYNCYTIAL VIRUS-RVPPCR: NOT DETECTED
Rhinovirus / Enterovirus: NOT DETECTED

## 2016-07-02 ENCOUNTER — Ambulatory Visit (INDEPENDENT_AMBULATORY_CARE_PROVIDER_SITE_OTHER): Payer: Medicare Other | Admitting: Physician Assistant

## 2016-07-02 ENCOUNTER — Encounter (INDEPENDENT_AMBULATORY_CARE_PROVIDER_SITE_OTHER): Payer: Self-pay | Admitting: Physician Assistant

## 2016-07-02 VITALS — Ht 70.0 in | Wt 231.4 lb

## 2016-07-02 DIAGNOSIS — R81 Glycosuria: Secondary | ICD-10-CM | POA: Diagnosis not present

## 2016-07-02 DIAGNOSIS — I10 Essential (primary) hypertension: Secondary | ICD-10-CM | POA: Diagnosis not present

## 2016-07-02 DIAGNOSIS — E1165 Type 2 diabetes mellitus with hyperglycemia: Secondary | ICD-10-CM | POA: Diagnosis not present

## 2016-07-02 DIAGNOSIS — R350 Frequency of micturition: Secondary | ICD-10-CM | POA: Diagnosis not present

## 2016-07-02 DIAGNOSIS — R3911 Hesitancy of micturition: Secondary | ICD-10-CM

## 2016-07-02 DIAGNOSIS — J42 Unspecified chronic bronchitis: Secondary | ICD-10-CM

## 2016-07-02 DIAGNOSIS — E039 Hypothyroidism, unspecified: Secondary | ICD-10-CM

## 2016-07-02 LAB — GLUCOSE, POCT (MANUAL RESULT ENTRY): POC Glucose: 204 mg/dl — AB (ref 70–99)

## 2016-07-02 LAB — POCT URINALYSIS DIPSTICK
BILIRUBIN UA: NEGATIVE
Ketones, UA: NEGATIVE
LEUKOCYTES UA: NEGATIVE
NITRITE UA: NEGATIVE
Protein, UA: 100
Spec Grav, UA: 1.02 (ref 1.010–1.025)
Urobilinogen, UA: 0.2 E.U./dL
pH, UA: 7 (ref 5.0–8.0)

## 2016-07-02 MED ORDER — METFORMIN HCL 1000 MG PO TABS: 1000.0000 mg | ORAL_TABLET | Freq: Two times a day (BID) | ORAL | 3 refills | Status: AC

## 2016-07-02 MED ORDER — LOSARTAN POTASSIUM 50 MG PO TABS: 50.0000 mg | ORAL_TABLET | Freq: Every day | ORAL | 3 refills | Status: AC

## 2016-07-02 MED ORDER — HYDROCHLOROTHIAZIDE 25 MG PO TABS: 25.0000 mg | ORAL_TABLET | Freq: Every day | ORAL | 1 refills | Status: AC

## 2016-07-02 NOTE — Progress Notes (Signed)
Subjective:  Patient ID: Richard Padilla, male    DOB: 1945/01/14  Age: 71 y.o. MRN: [id number]  CC: hospital f/u  HPI Richard Burowathaniel Herrmann is a 71 y.o. male presents on f/u of hospital admission from 06/26/16 to 06/29/16. Diagnosed with HTN, DM2, hypothyroidism, HLD, OSA, COPD exacerbation, acute respiratory failure, leg swelling, and hyperglycemia. Says his COPD is improving since his hospital stay. Using Symbicort and Proventil. Will have oxygen and a nebulizer sent to his home. Generally feels well except for urinary frequency, nocturia, and urinary hesitancy. Urinary symptoms for "a few months". Pt has had prostate examinations before but does not know what PCP has told him about his prostate. Does not endorse any other symptoms or complaints.      Outpatient Medications Prior to Visit  Medication Sig Dispense Refill  . acetaminophen (TYLENOL) 500 MG tablet Take 1,000 mg by mouth every 6 (six) hours as needed for mild pain, moderate pain, fever or headache.     . albuterol (PROVENTIL HFA;VENTOLIN HFA) 108 (90 Base) MCG/ACT inhaler Inhale 2 puffs into the lungs every 6 (six) hours as needed for wheezing or shortness of breath. 1 Inhaler 1  . aspirin EC 325 MG tablet Take 325 mg by mouth daily.    . budesonide-formoterol (SYMBICORT) 160-4.5 MCG/ACT inhaler Inhale 2 puffs into the lungs 2 (two) times daily.    . hydrALAZINE (APRESOLINE) 25 MG tablet Take 25 mg by mouth at bedtime.     Marland Kitchen. l-methylfolate-B6-B12 (METANX) 3-35-2 MG TABS Take 1 tablet by mouth daily.    Marland Kitchen. levothyroxine (SYNTHROID, LEVOTHROID) 125 MCG tablet Take 125 mcg by mouth daily before breakfast.     . loratadine (CLARITIN) 10 MG tablet Take 10 mg by mouth daily as needed for allergies.     . meloxicam (MOBIC) 15 MG tablet Take 7.5-15 mg by mouth daily as needed for pain.     . Multiple Vitamins-Minerals (ALIVE MENS ENERGY) TABS Take 1 tablet by mouth daily.    . polyethylene glycol (MIRALAX / GLYCOLAX) packet Take 17 g by mouth  daily. 14 each 0  . predniSONE (DELTASONE) 20 MG tablet Take 2 tablets (40 mg total) by mouth daily. 14 tablet 0  . rosuvastatin (CRESTOR) 40 MG tablet Take 40 mg by mouth at bedtime.     Marland Kitchen. losartan (COZAAR) 25 MG tablet Take 2 tablets (50 mg total) by mouth daily. 60 tablet 0  . metFORMIN (GLUCOPHAGE) 500 MG tablet Take 500 mg by mouth 2 (two) times daily.    Marland Kitchen. albuterol (PROVENTIL) (2.5 MG/3ML) 0.083% nebulizer solution Take 3 mLs (2.5 mg total) by nebulization every 6 (six) hours as needed for wheezing. (Patient not taking: Reported on 07/02/2016) 75 mL 0  . omeprazole (PRILOSEC) 20 MG capsule TAKE ONE CAPSULE EVERY DAY (Patient not taking: Reported on 07/02/2016) 30 capsule 1   No facility-administered medications prior to visit.      ROS Review of Systems  Constitutional: Negative for chills, fever and malaise/fatigue.  Eyes: Negative for blurred vision.  Respiratory: Positive for shortness of breath.   Cardiovascular: Negative for chest pain and palpitations.  Gastrointestinal: Negative for abdominal pain and nausea.  Genitourinary: Negative for dysuria and hematuria.  Musculoskeletal: Negative for joint pain and myalgias.  Skin: Negative for rash.  Neurological: Negative for tingling and headaches.  Psychiatric/Behavioral: Negative for depression. The patient is not nervous/anxious.     Objective:  Ht 5\' 10"  (1.778 m)   Wt 231 lb 6.4 oz (105 kg)  BMI 33.20 kg/m   BP/Weight 07/02/2016 06/29/2016 06/28/2016  Systolic BP - 169 -  Diastolic BP - 93 -  Wt. (Lbs) 231.4 - 231.48  BMI 33.2 - 31.39      Physical Exam  Constitutional: He is oriented to person, place, and time.  Well developed, overweight, NAD, polite  HENT:  Head: Normocephalic and atraumatic.  Eyes: No scleral icterus.  Cardiovascular: Normal rate, regular rhythm and normal heart sounds.   Pulmonary/Chest: Effort normal and breath sounds normal.  Somewhat reduced air movement bilaterally   Genitourinary:   Genitourinary Comments: Prostate approximately 25-30g, somewhat firm, no nodules, and non tender.  Musculoskeletal: He exhibits no edema.  Neurological: He is alert and oriented to person, place, and time.  Skin: Skin is warm and dry. No rash noted. No erythema. No pallor.  Psychiatric: He has a normal mood and affect. His behavior is normal. Thought content normal.  Vitals reviewed.    Assessment & Plan:   1. Type 2 diabetes mellitus with hyperglycemia, without long-term current use of insulin (HCC) - Increase metFORMIN (GLUCOPHAGE) 1000 MG tablet; Take 1 tablet (1,000 mg total) by mouth 2 (two) times daily with a meal.  Dispense: 180 tablet; Refill: 3 - CBG 204 in clinic today - A1c 7.8% on 06/26/16  2. Hypertension, unspecified type - Begin hydrochlorothiazide (HYDRODIURIL) 25 MG tablet; Take 1 tablet (25 mg total) by mouth daily. Take on tablet in the morning.  Dispense: 90 tablet; Refill: 1 - Begin losartan (COZAAR) 50 MG tablet; Take 1 tablet (50 mg total) by mouth daily.  Dispense: 90 tablet; Refill: 3 - Lipid Panel; Future  3. Hypothyroidism, unspecified type - Thyroid Panel With TSH; Future  4. Chronic bronchitis, unspecified chronic bronchitis type (HCC) - Continue on Symbicort and Proventil  5. Urinary hesitancy - PSA; Future  6. Urinary frequency - Urinalysis Dipstick - PSA; Future   Meds ordered this encounter  Medications  . hydrochlorothiazide (HYDRODIURIL) 25 MG tablet    Sig: Take 1 tablet (25 mg total) by mouth daily. Take on tablet in the morning.    Dispense:  90 tablet    Refill:  1    Order Specific Question:   Supervising Provider    Answer:   Quentin Angst [id number]  . losartan (COZAAR) 50 MG tablet    Sig: Take 1 tablet (50 mg total) by mouth daily.    Dispense:  90 tablet    Refill:  3    Order Specific Question:   Supervising Provider    Answer:   Quentin Angst [id number]  . metFORMIN (GLUCOPHAGE) 1000 MG tablet    Sig: Take 1  tablet (1,000 mg total) by mouth 2 (two) times daily with a meal.    Dispense:  180 tablet    Refill:  3    Order Specific Question:   Supervising Provider    Answer:   Quentin Angst [[id number]]    Follow-up: 1 week for BP check.  Loletta Specter PA

## 2016-07-02 NOTE — Patient Instructions (Signed)
You may talk to your PCP about Earlie ServerBreo Ellipta if you feel Symbicort is not maintaining your breathing well enough.    Chronic Obstructive Pulmonary Disease Chronic obstructive pulmonary disease (COPD) is a long-term (chronic) lung problem. When you have COPD, it is hard for air to get in and out of your lungs. The way your lungs work will never return to normal. Usually the condition gets worse over time. There are things you can do to keep yourself as healthy as possible. Your doctor may treat your condition with:  Medicines.  Quitting smoking, if you smoke.  Rehabilitation. This may involve a team of specialists.  Oxygen.  Exercise and changes to your diet.  Lung surgery.  Comfort measures (palliative care).  Follow these instructions at home: Medicines  Take over-the-counter and prescription medicines only as told by your doctor.  Talk to your doctor before taking any cough or allergy medicines. You may need to avoid medicines that cause your lungs to be dry. Lifestyle  If you smoke, stop. Smoking makes the problem worse. If you need help quitting, ask your doctor.  Avoid being around things that make your breathing worse. This may include smoke, chemicals, and fumes.  Stay active, but remember to also rest.  Learn and use tips on how to relax.  Make sure you get enough sleep. Most adults need at least 7 hours a night.  Eat healthy foods. Eat smaller meals more often. Rest before meals. Controlled breathing  Learn and use tips on how to control your breathing as told by your doctor. Try: ? Breathing in (inhaling) through your nose for 1 second. Then, pucker your lips and breath out (exhale) through your lips for 2 seconds. ? Putting one hand on your belly (abdomen). Breathe in slowly through your nose for 1 second. Your hand on your belly should move out. Pucker your lips and breathe out slowly through your lips. Your hand on your belly should move in as you breathe  out. Controlled coughing  Learn and use controlled coughing to clear mucus from your lungs. The steps are: 1. Lean your head a little forward. 2. Breathe in deeply. 3. Try to hold your breath for 3 seconds. 4. Keep your mouth slightly open while coughing 2 times. 5. Spit any mucus out into a tissue. 6. Rest and do the steps again 1 or 2 times as needed. General instructions  Make sure you get all the shots (vaccines) that your doctor recommends. Ask your doctor about a flu shot and a pneumonia shot.  Use oxygen therapy and therapy to help improve your lungs (pulmonary rehabilitation) if told by your doctor. If you need home oxygen therapy, ask your doctor if you should buy a tool to measure your oxygen level (oximeter).  Make a COPD action plan with your doctor. This helps you know what to do if you feel worse than usual.  Manage any other conditions you have as told by your doctor.  Avoid going outside when it is very hot, cold, or humid.  Avoid people who have a sickness you can catch (contagious).  Keep all follow-up visits as told by your doctor. This is important. Contact a doctor if:  You cough up more mucus than usual.  There is a change in the color or thickness of the mucus.  It is harder to breathe than usual.  Your breathing is faster than usual.  You have trouble sleeping.  You need to use your medicines more often than  usual.  You have trouble doing your normal activities such as getting dressed or walking around the house. Get help right away if:  You have shortness of breath while resting.  You have shortness of breath that stops you from: ? Being able to talk. ? Doing normal activities.  Your chest hurts for longer than 5 minutes.  Your skin color is more blue than usual.  Your pulse oximeter shows that you have low oxygen for longer than 5 minutes.  You have a fever.  You feel too tired to breathe normally. Summary  Chronic obstructive  pulmonary disease (COPD) is a long-term lung problem.  The way your lungs work will never return to normal. Usually the condition gets worse over time. There are things you can do to keep yourself as healthy as possible.  Take over-the-counter and prescription medicines only as told by your doctor.  If you smoke, stop. Smoking makes the problem worse. This information is not intended to replace advice given to you by your health care provider. Make sure you discuss any questions you have with your health care provider. Document Released: 06/12/2007 Document Revised: 06/01/2015 Document Reviewed: 08/20/2012 Elsevier Interactive Patient Education  2017 [employer].

## 2016-07-03 ENCOUNTER — Telehealth: Payer: Self-pay | Admitting: *Deleted

## 2016-07-05 ENCOUNTER — Other Ambulatory Visit (INDEPENDENT_AMBULATORY_CARE_PROVIDER_SITE_OTHER): Payer: Medicare Other

## 2016-07-05 DIAGNOSIS — I10 Essential (primary) hypertension: Secondary | ICD-10-CM

## 2016-07-05 DIAGNOSIS — R350 Frequency of micturition: Secondary | ICD-10-CM

## 2016-07-05 DIAGNOSIS — R3911 Hesitancy of micturition: Secondary | ICD-10-CM

## 2016-07-05 DIAGNOSIS — E039 Hypothyroidism, unspecified: Secondary | ICD-10-CM

## 2016-07-06 LAB — THYROID PANEL WITH TSH
Free Thyroxine Index: 3.1 (ref 1.2–4.9)
T3 Uptake Ratio: 33 % (ref 24–39)
T4 TOTAL: 9.5 ug/dL (ref 4.5–12.0)
TSH: 0.158 u[IU]/mL — ABNORMAL LOW (ref 0.450–4.500)

## 2016-07-06 LAB — LIPID PANEL
CHOLESTEROL TOTAL: 178 mg/dL (ref 100–199)
Chol/HDL Ratio: 2.3 ratio (ref 0.0–5.0)
HDL: 78 mg/dL (ref >39)
LDL CALC: 89 mg/dL (ref 0–99)
TRIGLYCERIDES: 55 mg/dL (ref 0–149)
VLDL Cholesterol Cal: 11 mg/dL (ref 5–40)

## 2016-07-06 LAB — PSA: Prostate Specific Ag, Serum: 0.9 ng/mL (ref 0.0–4.0)

## 2016-07-09 ENCOUNTER — Ambulatory Visit (HOSPITAL_COMMUNITY)
Admission: RE | Admit: 2016-07-09 | Discharge: 2016-07-09 | Disposition: A | Discharge status: Home / Self Care | Payer: Medicare Other | Source: Ambulatory Visit | Attending: Physician Assistant | Admitting: Physician Assistant

## 2016-07-09 ENCOUNTER — Other Ambulatory Visit: Payer: Self-pay

## 2016-07-09 ENCOUNTER — Encounter (INDEPENDENT_AMBULATORY_CARE_PROVIDER_SITE_OTHER): Payer: Self-pay | Admitting: Physician Assistant

## 2016-07-09 ENCOUNTER — Ambulatory Visit (INDEPENDENT_AMBULATORY_CARE_PROVIDER_SITE_OTHER): Payer: Medicare Other | Admitting: Physician Assistant

## 2016-07-09 ENCOUNTER — Ambulatory Visit (INDEPENDENT_AMBULATORY_CARE_PROVIDER_SITE_OTHER): Payer: TRICARE For Life (TFL) | Admitting: Physician Assistant

## 2016-07-09 VITALS — BP 106/70 | HR 89 | Temp 98.0°F | Resp 18 | Ht 72.0 in | Wt 221.0 lb

## 2016-07-09 DIAGNOSIS — R011 Cardiac murmur, unspecified: Secondary | ICD-10-CM | POA: Diagnosis not present

## 2016-07-09 DIAGNOSIS — I444 Left anterior fascicular block: Secondary | ICD-10-CM | POA: Diagnosis not present

## 2016-07-09 DIAGNOSIS — I10 Essential (primary) hypertension: Secondary | ICD-10-CM | POA: Diagnosis not present

## 2016-07-09 DIAGNOSIS — R079 Chest pain, unspecified: Secondary | ICD-10-CM | POA: Diagnosis not present

## 2016-07-09 DIAGNOSIS — R0789 Other chest pain: Secondary | ICD-10-CM | POA: Diagnosis not present

## 2016-07-09 NOTE — Patient Instructions (Signed)
Please go to [facility] at [facility] in [place]. You will have an EKG done and I will call you with results.  Nonspecific Chest Pain Chest pain can be caused by many different conditions. There is always a chance that your pain could be related to something serious, such as a heart attack or a blood clot in your lungs. Chest pain can also be caused by conditions that are not life-threatening. If you have chest pain, it is very important to follow up with your health care provider. What are the causes? Causes of this condition include:  Heartburn.  Pneumonia or bronchitis.  Anxiety or stress.  Inflammation around your heart (pericarditis) or lung (pleuritis or pleurisy).  A blood clot in your lung.  A collapsed lung (pneumothorax). This can develop suddenly on its own (spontaneous pneumothorax) or from trauma to the chest.  Shingles infection (varicella-zoster virus).  Heart attack.  Damage to the bones, muscles, and cartilage that make up your chest wall. This can include: ? Bruised bones due to injury. ? Strained muscles or cartilage due to frequent or repeated coughing or overwork. ? Fracture to one or more ribs. ? Sore cartilage due to inflammation (costochondritis).  What increases the risk? Risk factors for this condition may include:  Activities that increase your risk for trauma or injury to your chest.  Respiratory infections or conditions that cause frequent coughing.  Medical conditions or overeating that can cause heartburn.  Heart disease or family history of heart disease.  Conditions or health behaviors that increase your risk of developing a blood clot.  Having had chicken pox (varicella zoster).  What are the signs or symptoms? Chest pain can feel like:  Burning or tingling on the surface of your chest or deep in your chest.  Crushing, pressure, aching, or squeezing pain.  Dull or sharp pain that is worse when you move,  cough, or take a deep breath.  Pain that is also felt in your back, neck, shoulder, or arm, or pain that spreads to any of these areas.  Your chest pain may come and go, or it may stay constant. How is this diagnosed? Lab tests or other studies may be needed to find the cause of your pain. Your health care provider may have you take a test called an ECG (electrocardiogram). An ECG records your heartbeat patterns at the time the test is performed. You may also have other tests, such as:  Transthoracic echocardiogram (TTE). In this test, sound waves are used to create a picture of the heart structures and to look at how blood flows through your heart.  Transesophageal echocardiogram (TEE).This is a more advanced imaging test that takes images from inside your body. It allows your health care provider to see your heart in finer detail.  Cardiac monitoring. This allows your health care provider to monitor your heart rate and rhythm in real time.  Holter monitor. This is a portable device that records your heartbeat and can help to diagnose abnormal heartbeats. It allows your health care provider to track your heart activity for several days, if needed.  Stress tests. These can be done through exercise or by taking medicine that makes your heart beat more quickly.  Blood tests.  Other imaging tests.  How is this treated? Treatment depends on what is causing your chest pain. Treatment may include:  Medicines. These may include: ? Acid blockers for heartburn. ? Anti-inflammatory medicine. ? Pain medicine for inflammatory conditions. ?  Antibiotic medicine, if an infection is present. ? Medicines to dissolve blood clots. ? Medicines to treat coronary artery disease (CAD).  Supportive care for conditions that do not require medicines. This may include: ? Resting. ? Applying heat or cold packs to injured areas. ? Limiting activities until pain decreases.  Follow these instructions at  home: Medicines  If you were prescribed an antibiotic, take it as told by your health care provider. Do not stop taking the antibiotic even if you start to feel better.  Take over-the-counter and prescription medicines only as told by your health care provider. Lifestyle  Do not use any products that contain nicotine or tobacco, such as cigarettes and e-cigarettes. If you need help quitting, ask your health care provider.  Do not drink alcohol.  Make lifestyle changes as directed by your health care provider. These may include: ? Getting regular exercise. Ask your health care provider to suggest some activities that are safe for you. ? Eating a heart-healthy diet. A registered dietitian can help you to learn healthy eating options. ? Maintaining a healthy weight. ? Managing diabetes, if necessary. ? Reducing stress, such as with yoga or relaxation techniques. General instructions  Avoid any activities that bring on chest pain.  If heartburn is the cause for your chest pain, raise (elevate) the head of your bed about 6 inches (15 cm) by putting blocks under the legs. Sleeping with more pillows does not effectively relieve heartburn because it only changes the position of your head.  Keep all follow-up visits as told by your health care provider. This is important. This includes any further testing if your chest pain does not go away. Contact a health care provider if:  Your chest pain does not go away.  You have a rash with blisters on your chest.  You have a fever.  You have chills. Get help right away if:  Your chest pain is worse.  You have a cough that gets worse, or you cough up blood.  You have severe pain in your abdomen.  You have severe weakness.  You faint.  You have sudden, unexplained chest discomfort.  You have sudden, unexplained discomfort in your arms, back, neck, or jaw.  You have shortness of breath at any time.  You suddenly start to sweat, or  your skin gets clammy.  You feel nauseous or you vomit.  You suddenly feel light-headed or dizzy.  Your heart begins to beat quickly, or it feels like it is skipping beats. These symptoms may represent a serious problem that is an emergency. Do not wait to see if the symptoms will go away. Get medical help right away. Call your local emergency services (911 in the [place] Do not drive yourself to the hospital. This information is not intended to replace advice given to you by your health care provider. Make sure you discuss any questions you have with your health care provider. Document Released: 10/03/2004 Document Revised: 09/18/2015 Document Reviewed: 09/18/2015 Elsevier Interactive Patient Education  2017 [employer].

## 2016-07-09 NOTE — Progress Notes (Signed)
Subjective:  Patient ID: Richard Padilla, male    DOB: 07/04/1945  Age: [age] y.o. MRN: [id number]  CC: f/u HTN  HPI Richard Burowathaniel Ouderkirk is a 71 y.o. male with a PMH of HTN, HLD, Hypothyroidism, sleep apnea, and migraines presents on f/u for HTN. Blood pressure was elevated in the previous visit and was started on HCTZ 25 mg and Losartan 50 mg. BP today is 106/70 on dual therapy. Feeling well except for the heat outside. His wife/girlfriend?, whom is present during encounter, states he has been complaining of left hand pain and chest pressure. Patient says symptoms are "nothing" and have passed. Onset of chest pressure today while waiting in office. Also has "cramp" in the left hand and forearm. Cramp radiates from the left hand to the left forearm. Patient attributes cramps to previously working with hands and also to today's heat. Does not feel chest pain, palpitations, SOB, HA, abdominal pain, weakness. Does not endorse any other symptoms or complaints.      Outpatient Medications Prior to Visit  Medication Sig Dispense Refill  . acetaminophen (TYLENOL) 500 MG tablet Take 1,000 mg by mouth every 6 (six) hours as needed for mild pain, moderate pain, fever or headache.     . albuterol (PROVENTIL HFA;VENTOLIN HFA) 108 (90 Base) MCG/ACT inhaler Inhale 2 puffs into the lungs every 6 (six) hours as needed for wheezing or shortness of breath. 1 Inhaler 1  . aspirin EC 325 MG tablet Take 325 mg by mouth daily.    . budesonide-formoterol (SYMBICORT) 160-4.5 MCG/ACT inhaler Inhale 2 puffs into the lungs 2 (two) times daily.    . hydrALAZINE (APRESOLINE) 25 MG tablet Take 25 mg by mouth at bedtime.     . hydrochlorothiazide (HYDRODIURIL) 25 MG tablet Take 1 tablet (25 mg total) by mouth daily. Take on tablet in the morning. 90 tablet 1  . levothyroxine (SYNTHROID, LEVOTHROID) 125 MCG tablet Take 125 mcg by mouth daily before breakfast.     . loratadine (CLARITIN) 10 MG tablet Take 10 mg by mouth daily as  needed for allergies.     Marland Kitchen. losartan (COZAAR) 50 MG tablet Take 1 tablet (50 mg total) by mouth daily. 90 tablet 3  . meloxicam (MOBIC) 15 MG tablet Take 7.5-15 mg by mouth daily as needed for pain.     . metFORMIN (GLUCOPHAGE) 1000 MG tablet Take 1 tablet (1,000 mg total) by mouth 2 (two) times daily with a meal. 180 tablet 3  . Multiple Vitamins-Minerals (ALIVE MENS ENERGY) TABS Take 1 tablet by mouth daily.    . polyethylene glycol (MIRALAX / GLYCOLAX) packet Take 17 g by mouth daily. 14 each 0  . rosuvastatin (CRESTOR) 40 MG tablet Take 40 mg by mouth at bedtime.     Marland Kitchen. l-methylfolate-B6-B12 (METANX) 3-35-2 MG TABS Take 1 tablet by mouth daily.     No facility-administered medications prior to visit.      ROS Review of Systems  Constitutional: Negative for chills, fever and malaise/fatigue.  Eyes: Negative for blurred vision.  Respiratory: Negative for shortness of breath.   Cardiovascular: Negative for chest pain and palpitations.       Chest pressure  Gastrointestinal: Negative for abdominal pain and nausea.  Genitourinary: Negative for dysuria and hematuria.  Musculoskeletal: Negative for joint pain and myalgias.       Left hand cramp  Skin: Negative for rash.  Neurological: Negative for tingling and headaches.  Psychiatric/Behavioral: Negative for depression. The patient is not nervous/anxious.  Objective:  BP 106/70 (BP Location: Right Arm, Patient Position: Sitting, Cuff Size: Large)   Pulse 89   Temp 98 F (36.7 C) (Oral)   Resp 18   Ht 6' (1.829 m)   Wt 221 lb (100.2 kg)   SpO2 96%   BMI 29.97 kg/m   BP/Weight 07/09/2016 07/02/2016 06/29/2016  Systolic BP 106 - 169  Diastolic BP 70 - 93  Wt. (Lbs) 221 231.4 -  BMI 29.97 33.2 -      Physical Exam  Constitutional: He is oriented to person, place, and time.  Well developed, well nourished, NAD, polite  HENT:  Head: Normocephalic and atraumatic.  Eyes: No scleral icterus.  Neck: Normal range of motion.  Neck supple. No thyromegaly present.  Cardiovascular: Normal rate and regular rhythm.   Systolic murmur, no gallops, no rubs  Pulmonary/Chest: Effort normal and breath sounds normal.  Musculoskeletal: He exhibits no edema.  Neurological: He is alert and oriented to person, place, and time. No cranial nerve deficit. Coordination normal.  Skin: Skin is warm and dry. No rash noted. No erythema. No pallor.  Psychiatric: He has a normal mood and affect. His behavior is normal. Thought content normal.  Vitals reviewed.    Assessment & Plan:   1. Hypertension, unspecified type - Well controlled on Losartan 50 mg and HCTZ 25 mg  2. Chest pain, unspecified type - EKG 12-Lead done at [facility]. RBBB. - I have advised patient not to minimize his pains/symptoms if he indeed has pain/symptoms. Advised him to ask a provider if testing should be done to help r/o a cardiac etiology. He was also instructed to go to ED should there be any particularly worrisome symptoms.  3. Undiagnosed cardiac murmurs - Advised to f/u with cardiologist in [place]. Leaving in 7 days. Call here or ambulance if there are any symptoms of chest pain, arm pain, back pain, neck pain, sweating, SOB, dizziness, or other worrisome sxs.     Follow-up: Return if symptoms worsen or fail to improve.   Loletta Specter PA

## 2017-11-21 IMAGING — CR DG CHEST 2V
2 series · 2 of 2 positions shown · non-contrast
Comparison: 06/22/2016.  05/11/2015 .

CLINICAL DATA: Shortness of breath.

EXAM:
CHEST  2 VIEW

[w chest pa]
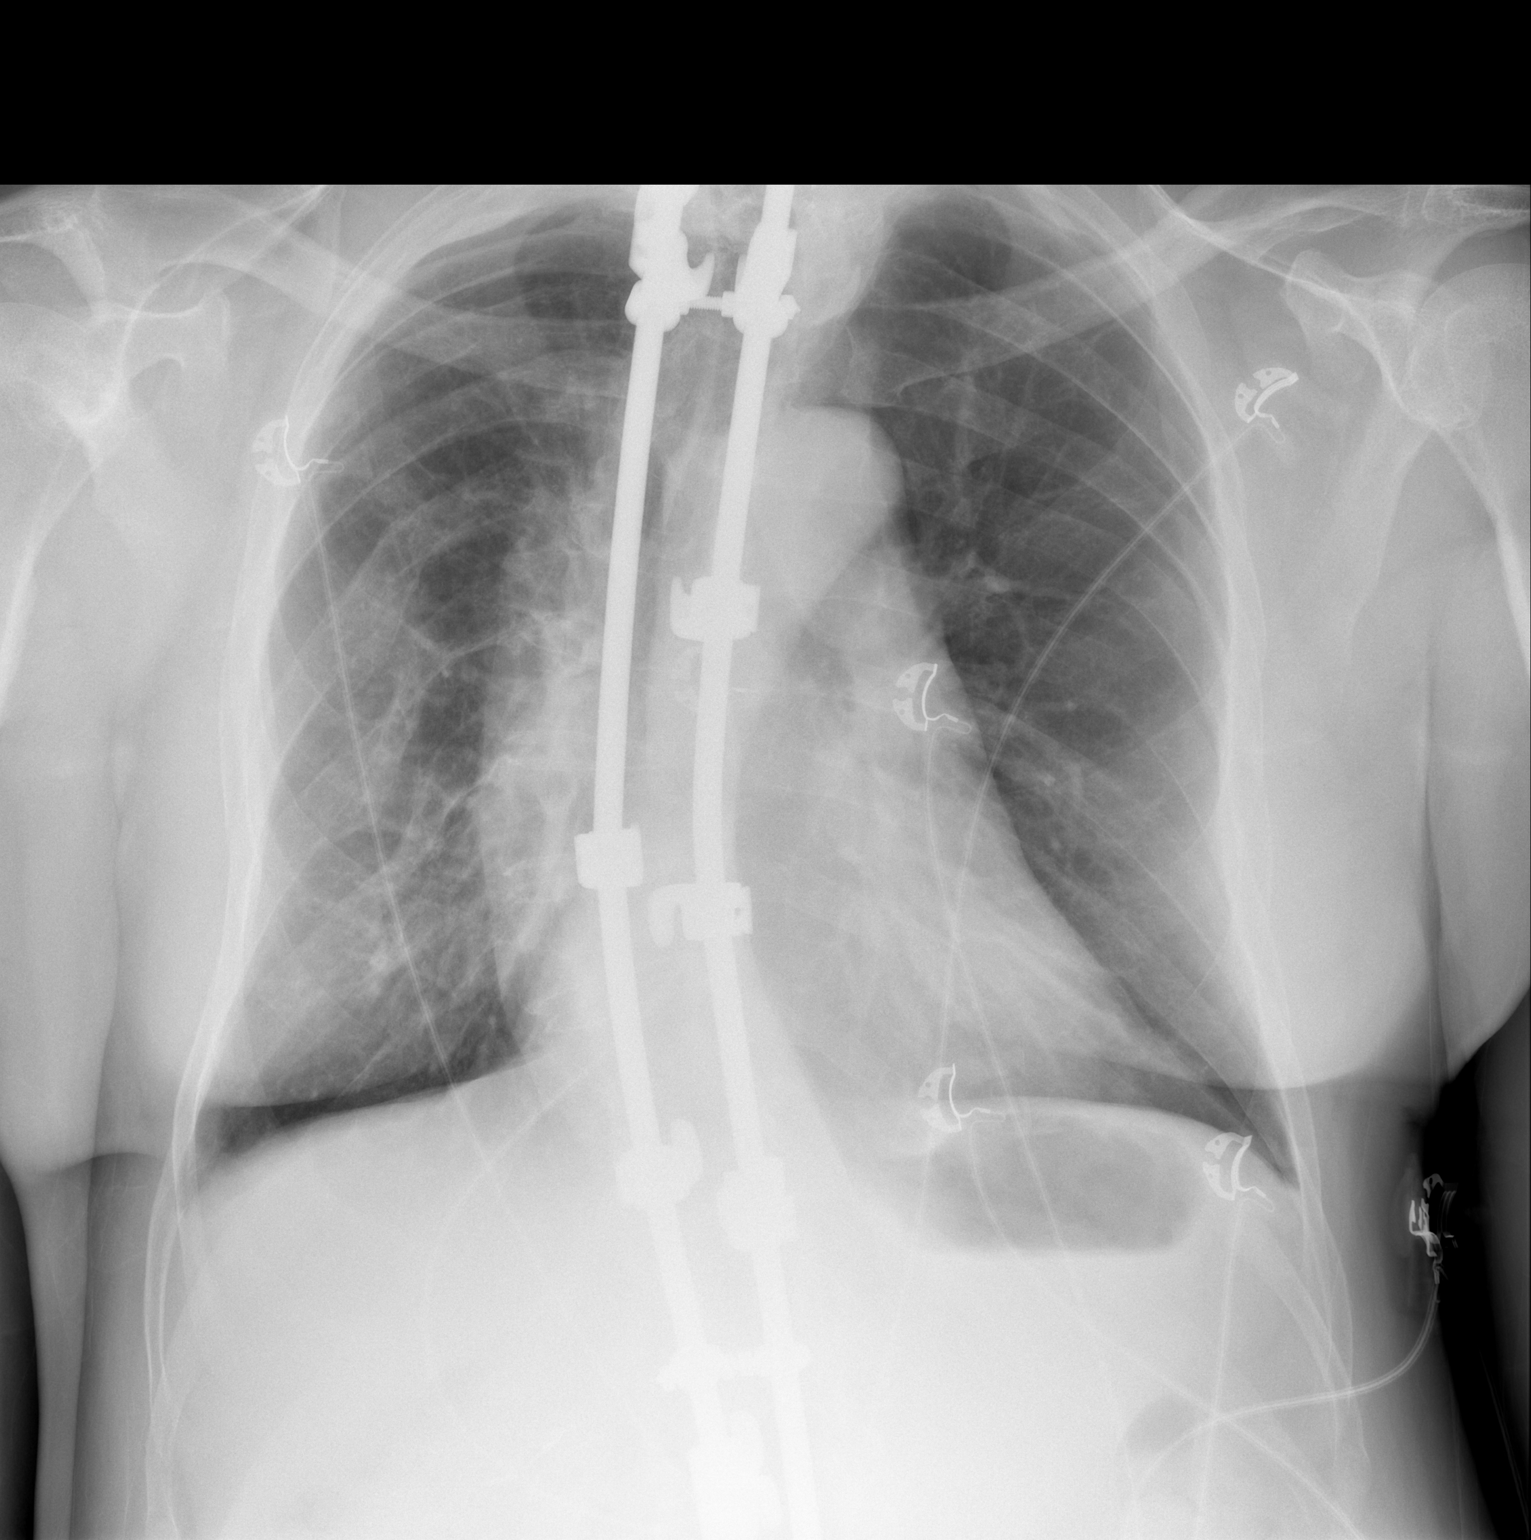

[w chest lat]
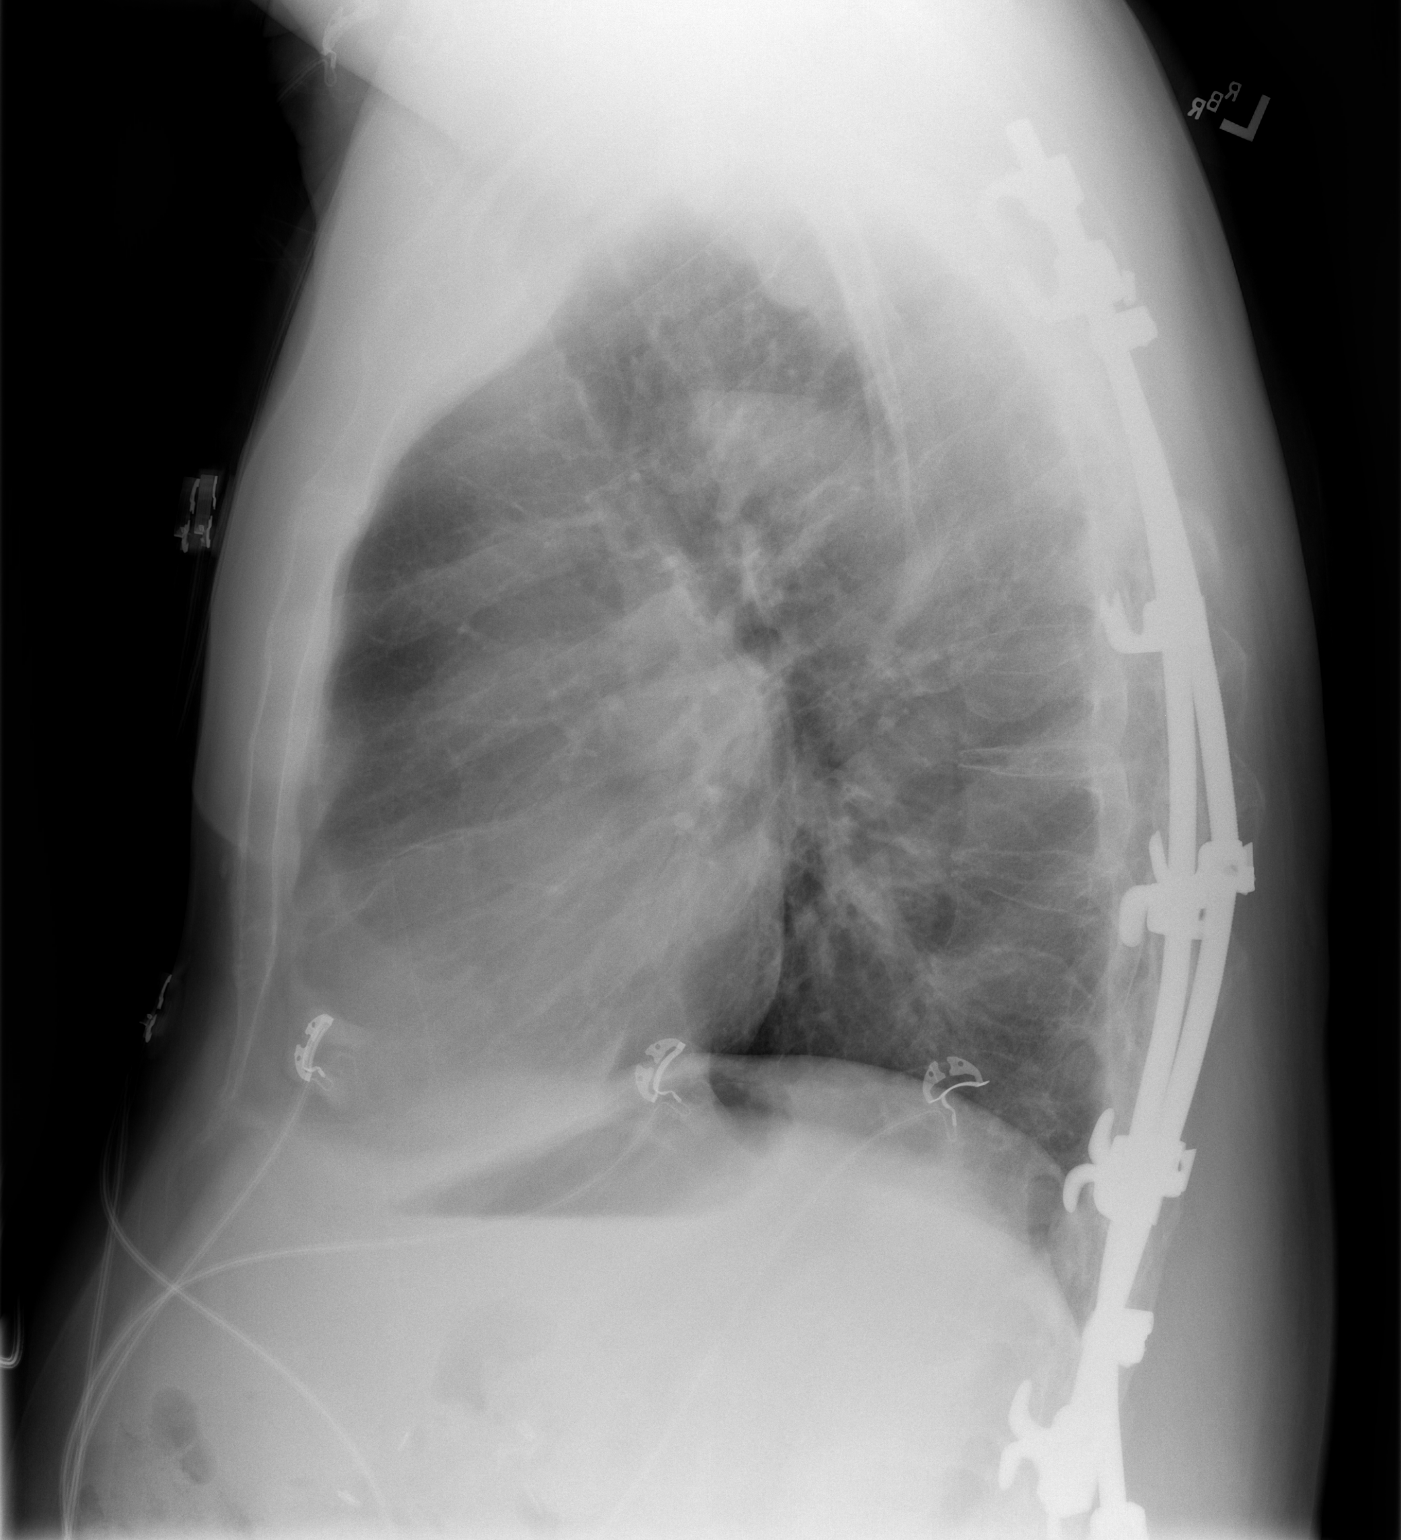

[2 of 2 positions shown; findings below may reference images not displayed]

FINDINGS: Heart size normal. Mild right base pleuroparenchymal thickening
again noted consistent with scarring. No acute pulmonary disease. No
pleural effusion or pneumothorax . Thoracic spine scoliosis with
prior fusion again noted. Deformity right ribs again noted. Surgical
clips upper abdomen.
IMPRESSION: No acute cardiopulmonary disease.

## 2018-01-16 ENCOUNTER — Emergency Department (HOSPITAL_BASED_OUTPATIENT_CLINIC_OR_DEPARTMENT_OTHER): Payer: Medicare Other

## 2018-01-16 ENCOUNTER — Encounter (HOSPITAL_BASED_OUTPATIENT_CLINIC_OR_DEPARTMENT_OTHER): Payer: Self-pay | Admitting: *Deleted

## 2018-01-16 ENCOUNTER — Emergency Department (HOSPITAL_BASED_OUTPATIENT_CLINIC_OR_DEPARTMENT_OTHER)
Admission: EM | Admit: 2018-01-16 | Discharge: 2018-01-16 | Disposition: A | Discharge status: Home / Self Care | Payer: Medicare Other | Attending: Emergency Medicine | Admitting: Emergency Medicine

## 2018-01-16 ENCOUNTER — Other Ambulatory Visit: Payer: Self-pay

## 2018-01-16 DIAGNOSIS — E039 Hypothyroidism, unspecified: Secondary | ICD-10-CM | POA: Diagnosis not present

## 2018-01-16 DIAGNOSIS — I10 Essential (primary) hypertension: Secondary | ICD-10-CM | POA: Diagnosis not present

## 2018-01-16 DIAGNOSIS — R05 Cough: Secondary | ICD-10-CM | POA: Diagnosis present

## 2018-01-16 DIAGNOSIS — Z760 Encounter for issue of repeat prescription: Secondary | ICD-10-CM | POA: Insufficient documentation

## 2018-01-16 DIAGNOSIS — Z79899 Other long term (current) drug therapy: Secondary | ICD-10-CM | POA: Diagnosis not present

## 2018-01-16 DIAGNOSIS — J441 Chronic obstructive pulmonary disease with (acute) exacerbation: Secondary | ICD-10-CM | POA: Insufficient documentation

## 2018-01-16 DIAGNOSIS — Z87891 Personal history of nicotine dependence: Secondary | ICD-10-CM | POA: Diagnosis not present

## 2018-01-16 DIAGNOSIS — J209 Acute bronchitis, unspecified: Secondary | ICD-10-CM

## 2018-01-16 DIAGNOSIS — Z7982 Long term (current) use of aspirin: Secondary | ICD-10-CM | POA: Diagnosis not present

## 2018-01-16 MED ORDER — LOSARTAN POTASSIUM 100 MG PO TABS: 100.0000 mg | ORAL_TABLET | Freq: Every day | ORAL | 1 refills | Status: AC

## 2018-01-16 MED ORDER — PREDNISONE 10 MG PO TABS: 40.0000 mg | ORAL_TABLET | Freq: Every day | ORAL | 0 refills | Status: AC

## 2018-01-16 MED ORDER — IPRATROPIUM-ALBUTEROL 0.5-2.5 (3) MG/3ML IN SOLN
RESPIRATORY_TRACT | Status: AC
  Administered 2018-01-16: 3 mL
  Filled 2018-01-16: qty 3

## 2018-01-16 MED ORDER — PREDNISONE 50 MG PO TABS
60.0000 mg | ORAL_TABLET | Freq: Once | ORAL | Status: AC
  Administered 2018-01-16: 60 mg via ORAL
  Filled 2018-01-16: qty 1

## 2018-01-16 MED ORDER — ALBUTEROL SULFATE (2.5 MG/3ML) 0.083% IN NEBU
INHALATION_SOLUTION | RESPIRATORY_TRACT | Status: AC
  Administered 2018-01-16: 2.5 mg
  Filled 2018-01-16: qty 3

## 2018-01-16 MED ORDER — LEVOTHYROXINE SODIUM 175 MCG PO TABS: 175.0000 ug | ORAL_TABLET | Freq: Every day | ORAL | 1 refills | Status: AC

## 2018-01-16 MED ORDER — DM-GUAIFENESIN ER 30-600 MG PO TB12: 1.0000 | ORAL_TABLET | Freq: Two times a day (BID) | ORAL | 1 refills | Status: AC

## 2018-01-16 MED ORDER — ALBUTEROL SULFATE HFA 108 (90 BASE) MCG/ACT IN AERS: 1.0000 | INHALATION_SPRAY | Freq: Four times a day (QID) | RESPIRATORY_TRACT | 0 refills | Status: AC | PRN

## 2018-01-16 MED FILL — MUCINEX DM ER 600-30 MG TAB: 30-600 | 10 days supply | Qty: 20 | Fill #0

## 2018-01-16 MED FILL — PROAIR HFA 90 MCG INHALER: 108 (90 BAS | 25 days supply | Qty: 9 | Fill #0

## 2018-01-16 MED FILL — LOSARTAN POTASSIUM 100 MG T: 100 | 30 days supply | Qty: 30 | Fill #0

## 2018-01-16 MED FILL — LEVOTHYROXINE 175 MCG TAB: 175 | 30 days supply | Qty: 30 | Fill #0

## 2018-01-16 MED FILL — predniSONE 10 MG TABS: 10 | 5 days supply | Qty: 20 | Fill #0

## 2018-01-16 NOTE — ED Provider Notes (Signed)
[facility] EMERGENCY DEPARTMENT Provider Note   CSN: [id number] Arrival date & time: 01/16/18  1119     History   Chief Complaint Chief Complaint  Patient presents with  . Cough    HPI Richard Padilla is a 73 y.o. male.  Patient currently staying in [place] but is originally from the [place] area.  He presents with cough and cold symptoms for the last week.  Did have some diarrhea about a week ago but is resolved.  Uses CPAP at night.  Has a probable history of COPD because he is on Symbicort as well as uses albuterol nebulizers he is run out of his albuterol inhaler.  He is also run out of his Cozaar.  And he is about ready to run out of his Synthroid.  He needs refills on both of those.  Denies any fevers.  Has had an occasional productive cough.  Has had some worsening shortness of breath the past few days.     Past Medical History:  Diagnosis Date  . Hypercholesteremia   . Hypertension   . Migraines   . Sleep apnea   . Thyroid disease     Patient Active Problem List   Diagnosis Date Noted  . COPD exacerbation (HCC) 06/26/2016  . COPD (chronic obstructive pulmonary disease) (HCC) 06/26/2016  . Acute respiratory failure with hypoxia (HCC) 06/26/2016  . Leg swelling 06/26/2016  . CAP (community acquired pneumonia) 09/20/2011  . COPD with acute exacerbation (HCC) 09/09/2011  . HTN (hypertension) 09/09/2011  . Hypothyroidism 09/09/2011  . Hyperlipidemia 09/09/2011  . OSA on CPAP 09/09/2011  . Constipation 09/09/2011  . Hyperglycemia 09/09/2011  . Acute respiratory failure with hypercapnia (HCC) 09/09/2011    Past Surgical History:  Procedure Laterality Date  . BACK SURGERY    . THYROID SURGERY          Home Medications    Prior to Admission medications   Medication Sig Start Date End Date Taking? Authorizing Provider  acetaminophen (TYLENOL) 500 MG tablet Take 1,000 mg by mouth every 6 (six) hours as needed for mild pain,  moderate pain, fever or headache.    Yes [provider]  budesonide-formoterol (SYMBICORT) 160-4.5 MCG/ACT inhaler Inhale 2 puffs into the lungs 2 (two) times daily.   Yes [provider]  hydrochlorothiazide (HYDRODIURIL) 25 MG tablet Take 1 tablet (25 mg total) by mouth daily. Take on tablet in the morning. 07/02/16  Yes Loletta SpecterGomez, Roger David, PA-C  levothyroxine (SYNTHROID, LEVOTHROID) 125 MCG tablet Take 125 mcg by mouth daily before breakfast.    Yes [provider]  losartan (COZAAR) 50 MG tablet Take 1 tablet (50 mg total) by mouth daily. 07/02/16  Yes Loletta SpecterGomez, Roger David, PA-C  metFORMIN (GLUCOPHAGE) 1000 MG tablet Take 1 tablet (1,000 mg total) by mouth 2 (two) times daily with a meal. 07/02/16  Yes Loletta SpecterGomez, Roger David, PA-C  Multiple Vitamins-Minerals (ALIVE MENS ENERGY) TABS Take 1 tablet by mouth daily.   Yes [provider]  OMEPRAZOLE PO Take by mouth.   Yes [provider]  polyethylene glycol (MIRALAX / GLYCOLAX) packet Take 17 g by mouth daily. 06/29/16  Yes Glade LloydAlekh, Kshitiz, MD  propranolol ER (INDERAL LA) 120 MG 24 hr capsule Take 120 mg by mouth daily.   Yes [provider]  rosuvastatin (CRESTOR) 40 MG tablet Take 40 mg by mouth at bedtime.    Yes [provider]  albuterol (PROVENTIL HFA;VENTOLIN HFA) 108 (90 Base) MCG/ACT inhaler Inhale 2  puffs into the lungs every 6 (six) hours as needed for wheezing or shortness of breath. 06/29/16   Glade LloydAlekh, Kshitiz, MD  albuterol (PROVENTIL HFA;VENTOLIN HFA) 108 (90 Base) MCG/ACT inhaler Inhale 1-2 puffs into the lungs every 6 (six) hours as needed for wheezing or shortness of breath. 01/16/18   Vanetta MuldersZackowski, Callaway Hailes, MD  aspirin EC 325 MG tablet Take 325 mg by mouth daily.    [provider]  dextromethorphan-guaiFENesin (MUCINEX DM) 30-600 MG 12hr tablet Take 1 tablet by mouth 2 (two) times daily. 01/16/18   Vanetta MuldersZackowski, Mazell Aylesworth, MD  hydrALAZINE (APRESOLINE) 25 MG tablet Take 25 mg by mouth  at bedtime.     [provider]  l-methylfolate-B6-B12 (METANX) 3-35-2 MG TABS Take 1 tablet by mouth daily.    [provider]  levothyroxine (SYNTHROID, LEVOTHROID) 175 MCG tablet Take 1 tablet (175 mcg total) by mouth daily before breakfast. 01/16/18   Vanetta MuldersZackowski, Diante Barley, MD  loratadine (CLARITIN) 10 MG tablet Take 10 mg by mouth daily as needed for allergies.     [provider]  losartan (COZAAR) 100 MG tablet Take 1 tablet (100 mg total) by mouth daily. 01/16/18   Vanetta MuldersZackowski, Wilberta Dorvil, MD  meloxicam (MOBIC) 15 MG tablet Take 7.5-15 mg by mouth daily as needed for pain.     [provider]  predniSONE (DELTASONE) 10 MG tablet Take 4 tablets (40 mg total) by mouth daily. 01/16/18   Vanetta MuldersZackowski, Meshia Rau, MD    Family History No family history on file.  Social History Social History   Tobacco Use  . Smoking status: Former Smoker    Packs/day: 2.00    Years: 20.00    Pack years: 40.00    Types: Cigarettes    Last attempt to quit: 09/08/1991    Years since quitting: 26.3  . Smokeless tobacco: Never Used  Substance Use Topics  . Alcohol use: Yes    Alcohol/week: 0.0 standard drinks    Comment: occ  . Drug use: No     Allergies   Patient has no known allergies.   Review of Systems Review of Systems  Constitutional: Negative for chills and fever.  HENT: Positive for congestion. Negative for rhinorrhea and sore throat.   Eyes: Negative for redness and visual disturbance.  Respiratory: Positive for cough, shortness of breath and wheezing.   Cardiovascular: Negative for chest pain and leg swelling.  Gastrointestinal: Positive for diarrhea. Negative for abdominal pain, nausea and vomiting.  Genitourinary: Negative for dysuria.  Musculoskeletal: Negative for back pain and neck pain.  Skin: Negative for rash.  Neurological: Negative for dizziness, syncope, light-headedness and headaches.  Hematological: Does not bruise/bleed easily.    Psychiatric/Behavioral: Negative for confusion.     Physical Exam Updated Vital Signs BP 102/66 (BP Location: Right Arm)   Pulse (!) 56   Temp 98 F (36.7 C) (Oral)   Resp 20   Ht 1.829 m (6')   Wt 101.2 kg   SpO2 95%   BMI 30.24 kg/m   Physical Exam Vitals signs and nursing note reviewed.  Constitutional:      General: He is not in acute distress.    Appearance: Normal appearance. He is well-developed. He is not ill-appearing.  HENT:     Head: Normocephalic and atraumatic.     Mouth/Throat:     Mouth: Mucous membranes are moist.  Eyes:     Extraocular Movements: Extraocular movements intact.     Conjunctiva/sclera: Conjunctivae normal.     Pupils: Pupils are equal,  round, and reactive to light.  Neck:     Musculoskeletal: Normal range of motion and neck supple.  Cardiovascular:     Rate and Rhythm: Normal rate and regular rhythm.     Heart sounds: No murmur.  Pulmonary:     Effort: Pulmonary effort is normal. No respiratory distress.     Breath sounds: Wheezing present.  Abdominal:     Palpations: Abdomen is soft.     Tenderness: There is no abdominal tenderness.  Musculoskeletal: Normal range of motion.        General: No swelling.  Skin:    General: Skin is warm and dry.     Capillary Refill: Capillary refill takes less than 2 seconds.  Neurological:     General: No focal deficit present.     Mental Status: He is alert and oriented to person, place, and time.      ED Treatments / Results  Labs (all labs ordered are listed, but only abnormal results are displayed) Labs Reviewed - No data to display  EKG None  Radiology Dg Chest 2 View  Result Date: 01/16/2018 CLINICAL DATA:  Cough. EXAM: CHEST - 2 VIEW COMPARISON:  Radiograph June 27, 2016. FINDINGS: The heart size and mediastinal contours are within normal limits. No pneumothorax or pleural effusion is noted. No acute pulmonary disease is noted. Scoliosis of thoracic spine is noted with Harrington  rods. IMPRESSION: No active cardiopulmonary disease. Electronically Signed   By: Lupita Raider, M.D.   On: 01/16/2018 12:28    Procedures Procedures (including critical care time)  Medications Ordered in ED Medications  albuterol (PROVENTIL) (2.5 MG/3ML) 0.083% nebulizer solution (2.5 mg  Given 01/16/18 1150)  ipratropium-albuterol (DUONEB) 0.5-2.5 (3) MG/3ML nebulizer solution (3 mLs  Given 01/16/18 1150)  predniSONE (DELTASONE) tablet 60 mg (60 mg Oral Given 01/16/18 1334)     Initial Impression / Assessment and Plan / ED Course  I have reviewed the triage vital signs and the nursing notes.  Pertinent labs & imaging results that were available during my care of the patient were reviewed by me and considered in my medical decision making (see chart for details).     Patient with probable past history of COPD.  Patient's on inhalers to include albuterol and prednisone inhaler.  Patient also ran out of his Synthroid or soon to run out of his Synthroid.  Patient also has run out of his Cozaar.  Patient's had a productive cough.  Some difficulty sleeping at night.  Had some faint wheezing when he arrived here cleared with an albuterol nebulizer treatment.  Patient given an oral dose of prednisone 60 mg.  He will be continued on prednisone for the next 5 days.  He will continue with the there is albuterol nebulizer or inhaler at home every 6 hours for the next 7 days.  Given Mucinex DM to help with the productive cough.  Feel that this was an exacerbation of his COPD.  Chest x-ray was negative for pneumonia.  Wheezing resolved.  Final Clinical Impressions(s) / ED Diagnoses   Final diagnoses:  Acute bronchitis, unspecified organism  Chronic obstructive pulmonary disease with acute exacerbation (HCC)  Medication refill    ED Discharge Orders         Ordered    albuterol (PROVENTIL HFA;VENTOLIN HFA) 108 (90 Base) MCG/ACT inhaler  Every 6 hours PRN     01/16/18 1408    predniSONE  (DELTASONE) 10 MG tablet  Daily     01/16/18  1408    dextromethorphan-guaiFENesin (MUCINEX DM) 30-600 MG 12hr tablet  2 times daily     01/16/18 1408    levothyroxine (SYNTHROID, LEVOTHROID) 175 MCG tablet  Daily before breakfast     01/16/18 1408    losartan (COZAAR) 100 MG tablet  Daily     01/16/18 1408           Vanetta Mulders, MD 01/16/18 1501

## 2018-01-16 NOTE — Discharge Instructions (Signed)
Medications renewed.  Take the prednisone as directed for the next 5 days.  Use albuterol nebulizer or albuterol inhaler every 6 hours for the next 7 days.  Then as needed.  Take the Mucinex DM for the phlegm.  Return for any new or worse symptoms.  Chest x-ray negative for pneumonia.

## 2018-01-16 NOTE — ED Notes (Signed)
Patient transported to X-ray 

## 2018-01-16 NOTE — ED Triage Notes (Signed)
Cough and cold symptoms. States last week he had diarrhea but none now.

## 2018-10-07 ENCOUNTER — Encounter: Payer: Self-pay | Admitting: Family Medicine

## 2018-10-07 ENCOUNTER — Other Ambulatory Visit: Payer: Self-pay

## 2018-10-07 ENCOUNTER — Ambulatory Visit (INDEPENDENT_AMBULATORY_CARE_PROVIDER_SITE_OTHER): Payer: Medicare Other | Admitting: Family Medicine

## 2018-10-07 VITALS — BP 130/82 | HR 57 | Temp 95.6°F | Ht 72.0 in | Wt 225.5 lb

## 2018-10-07 DIAGNOSIS — M7022 Olecranon bursitis, left elbow: Secondary | ICD-10-CM | POA: Diagnosis not present

## 2018-10-07 NOTE — Progress Notes (Signed)
Musculoskeletal Exam  Patient: Richard Padilla DOB: November 08, 1945  DOS: 10/07/2018  SUBJECTIVE:  Chief Complaint:   Chief Complaint  Patient presents with  . New Patient (Initial Visit)    left elbow fluid and wants it drained    Richard Padilla is a 73 y.o.  male for evaluation and treatment of elbow pain.   Onset:  2 months ago. Hit elbow against a dresser Location: L elbow  Character:  aching  Progression of issue:  is unchanged Associated symptoms: swelling (large bump over elbow) Treatment: to date has been ice, acetaminophen and compression.   Neurovascular symptoms: no  ROS: Musculoskeletal/Extremities: +elbow bump  Past Medical History:  Diagnosis Date  . Hypercholesteremia   . Hypertension   . Migraines   . Sleep apnea   . Thyroid disease     Objective: VITAL SIGNS: BP 130/82 (BP Location: Left Arm, Patient Position: Sitting, Cuff Size: Normal)   Pulse (!) 57   Temp (!) 95.6 F (35.3 C) (Temporal)   Ht 6' (1.829 m)   Wt 225 lb 8 oz (102.3 kg)   SpO2 93%   BMI 30.58 kg/m  Constitutional: Well formed, well developed. No acute distress. Cardiovascular: Brisk cap refill Thorax & Lungs: No accessory muscle use Musculoskeletal: L elbow.   Normal active range of motion: yes.   Normal passive range of motion: yes Tenderness to palpation: mild ttp over olecranon/bursa Deformity: large bursa over olecranon Ecchymosis: no Tests positive:  Tests negative: Neurologic: Normal sensory function.  Psychiatric: Normal mood. Age appropriate judgment and insight. Alert & oriented x 3.    Procedure note; bursa drained/injection Verbal consent obtained The area was marked with an otoscope speculum and cleaned with alcohol. Free spray was then used for topical anesthesia 23-gauge needle was used to enter the enlarged bursa distally parallel to the ulna. Approximately 11 mL of straw-colored fluid mixed with blood was removed. 20 mg of Depo-Medrol with 1 mL of 1%  lidocaine without epinephrine was then injected A Band-Aid was placed. The patient tolerated the procedure well, reported improvement in symptoms and there were no immediate complications noted.  Assessment:  Olecranon bursitis of left elbow - Plan: PR DRAIN/INJECT INTERMEDIATE JOINT/BURSA  Plan: Continue ice, Tylenol, compression.  Warned that this may recur and to let us know if this does happen.  He does not plan on transferring his care here, just came for the procedure.  I told him to be careful about schedule with PCPs in the future expecting them to do certain procedures and to verify in the future. F/u prn. The patient voiced understanding and agreement to the plan.   [place], DO 10/07/18  8:44 AM

## 2018-10-07 NOTE — Patient Instructions (Signed)
Ice/cold pack over area for 10-15 min twice daily.  Continue compression.  Let us know if you need anything.

## 2018-10-09 DIAGNOSIS — M7022 Olecranon bursitis, left elbow: Secondary | ICD-10-CM | POA: Diagnosis not present

## 2018-10-09 MED ORDER — METHYLPREDNISOLONE ACETATE 40 MG/ML IJ SUSP
20.0000 mg | Freq: Once | INTRAMUSCULAR | Status: AC
  Administered 2018-10-09: 09:00:00 20 mg via INTRA_ARTICULAR

## 2018-10-09 NOTE — Addendum Note (Signed)
Addended by: Sharon Seller B on: 10/09/2018 09:29 AM   Modules accepted: Orders

## 2018-11-03 ENCOUNTER — Other Ambulatory Visit: Payer: Self-pay

## 2018-11-04 ENCOUNTER — Encounter: Payer: Self-pay | Admitting: Family Medicine

## 2018-11-04 ENCOUNTER — Ambulatory Visit (INDEPENDENT_AMBULATORY_CARE_PROVIDER_SITE_OTHER): Payer: Medicare Other | Admitting: Family Medicine

## 2018-11-04 ENCOUNTER — Other Ambulatory Visit: Payer: Self-pay

## 2018-11-04 VITALS — BP 140/80 | HR 72 | Temp 96.4°F | Ht 72.0 in | Wt 224.2 lb

## 2018-11-04 DIAGNOSIS — M7022 Olecranon bursitis, left elbow: Secondary | ICD-10-CM | POA: Diagnosis not present

## 2018-11-04 DIAGNOSIS — Z23 Encounter for immunization: Secondary | ICD-10-CM

## 2018-11-04 NOTE — Progress Notes (Signed)
Chief Complaint  Patient presents with  . Follow-up    Subjective: Patient is a 73 y.o. male here for f/u bursitis.  Patient was seen on 9/30 and diagnosed with olecranon bursitis.  Aspiration with steroid injection was performed.  He reports he is better overall but is still having some mild fluid present.  Achiness is much better.  Range of motion is still intact.  He is requesting to have some residual fluid drained today.   ROS:  MSK: As noted in HPI  Past Medical History:  Diagnosis Date  . Hypercholesteremia   . Hypertension   . Migraines   . Sleep apnea   . Thyroid disease     Objective: BP 140/80 (BP Location: Left Arm, Patient Position: Sitting, Cuff Size: Normal)   Pulse 72   Temp (!) 96.4 F (35.8 C) (Temporal)   Ht 6' (1.829 m)   Wt 224 lb 4 oz (101.7 kg)   SpO2 93%   BMI 30.41 kg/m  General: Awake, appears stated age HEENT: MMM, EOMi Heart: RRR, no murmurs Lungs: CTAB, no rales, wheezes or rhonchi. No accessory muscle use Psych: Age appropriate judgment and insight, normal affect and mood  Procedure note; left bursitis aspiration Verbal consent obtained The area of interest was cleaned with alcohol and free spray was used for topical anesthesia A 22-gauge needle was used to withdraw approximately 0.2 mL of straw-colored fluid mixed with blood A Band-Aid was placed The patient tolerated the procedure well There were no immediate complications noted  Assessment and Plan: Olecranon bursitis of left elbow - Plan: PR DRAIN/INJECT INTERMEDIATE JOINT/BURSA  Need for influenza vaccination - Plan: Flu Vaccine QUAD High Dose(Fluad)  Told patient that there was not much in there but I would try to get some out.  He is Patent attorney.  I did educate him that this is likely soft tissue that he is feeling as well.  This will take some time to return to normal.  He requested a copy of my office note from September which we did provide to them. Follow-up as  needed. The patient voiced understanding and agreement to the plan.  [place], DO 11/04/18  8:44 AM

## 2018-11-04 NOTE — Patient Instructions (Addendum)
Continue icing as needed.   This can take some time to return to normal. The bursa was stretched out for quite some time.  If you need any other notes, let us know.   Let us know if you need anything.

## 2019-06-13 IMAGING — CR DG CHEST 2V
2 series · 2 of 2 positions shown · non-contrast
Comparison: Radiograph June 27, 2016.

CLINICAL DATA: Cough.

EXAM:
CHEST - 2 VIEW

[w chest pa *]
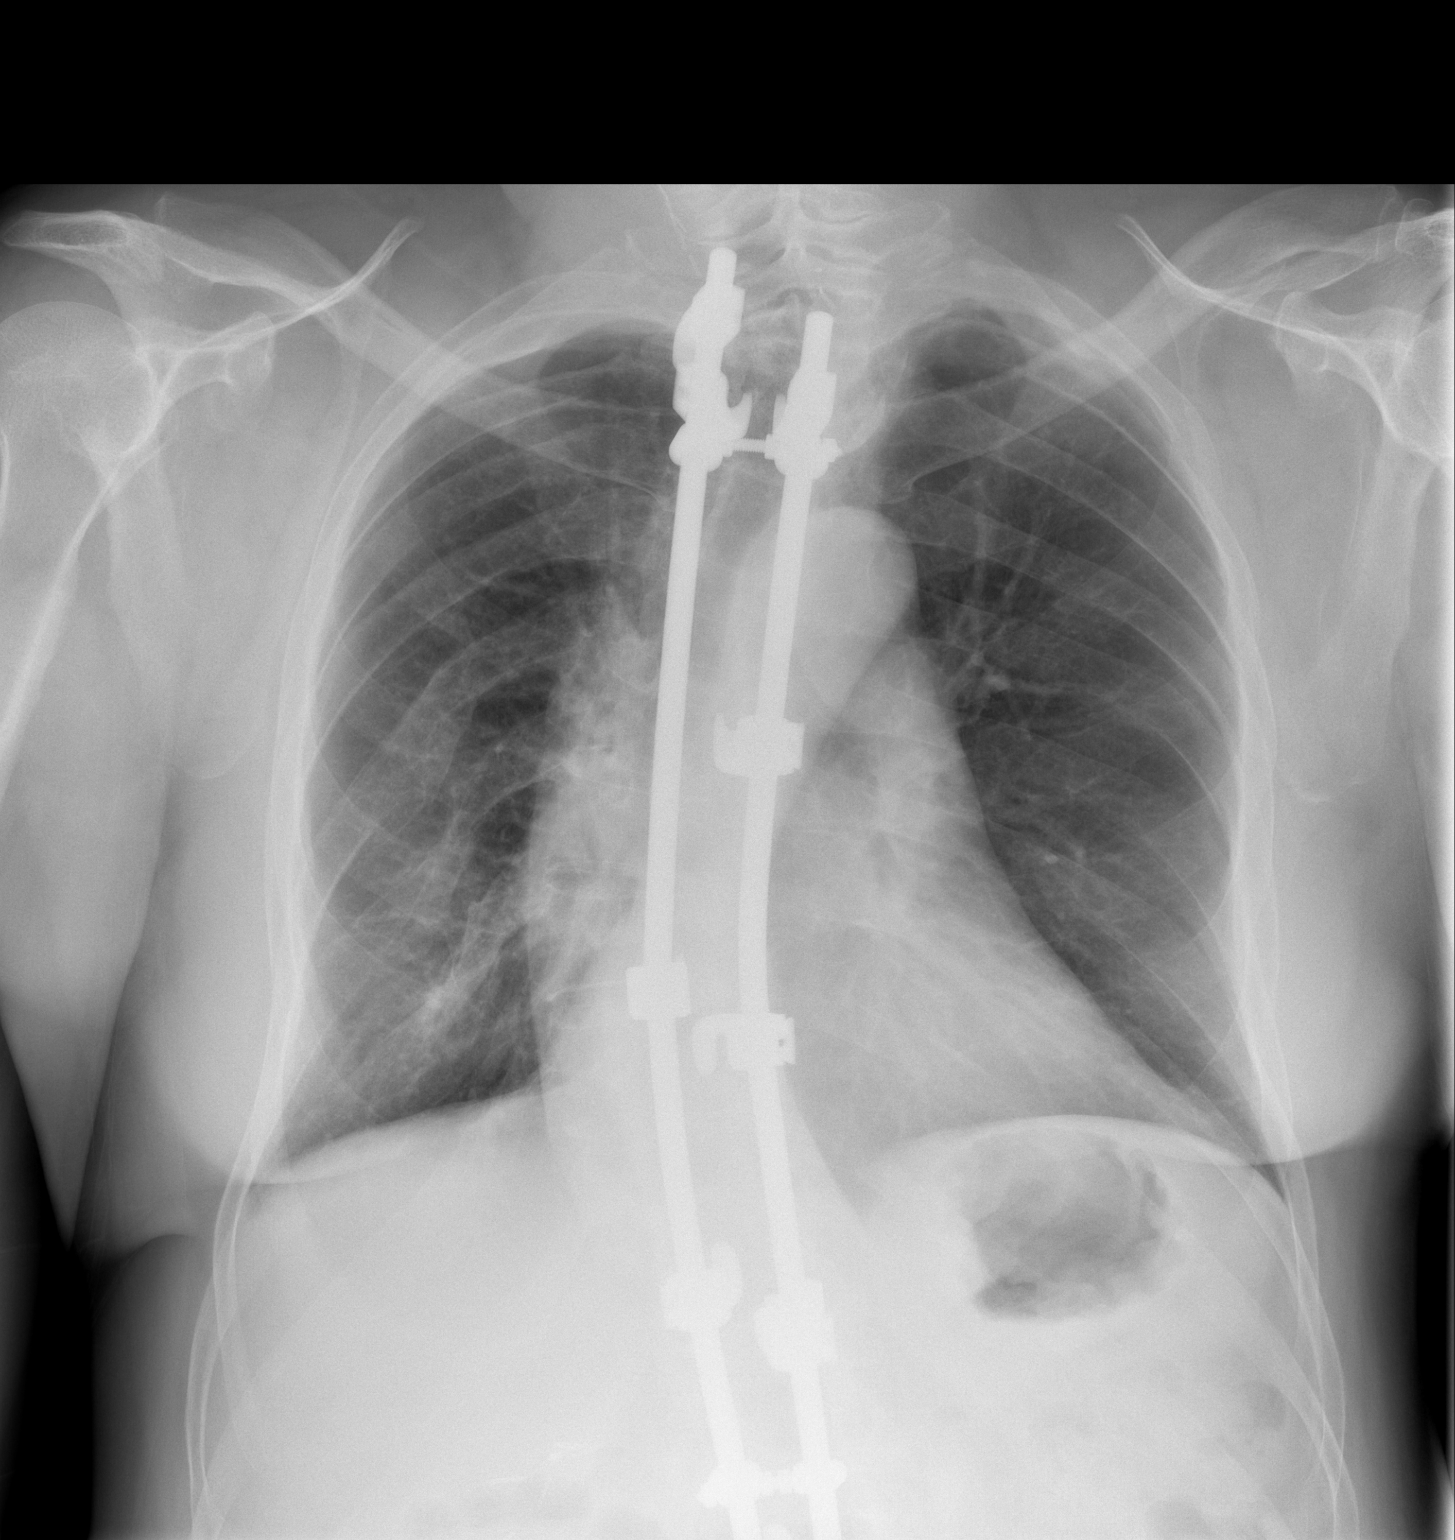

[w chest lat]
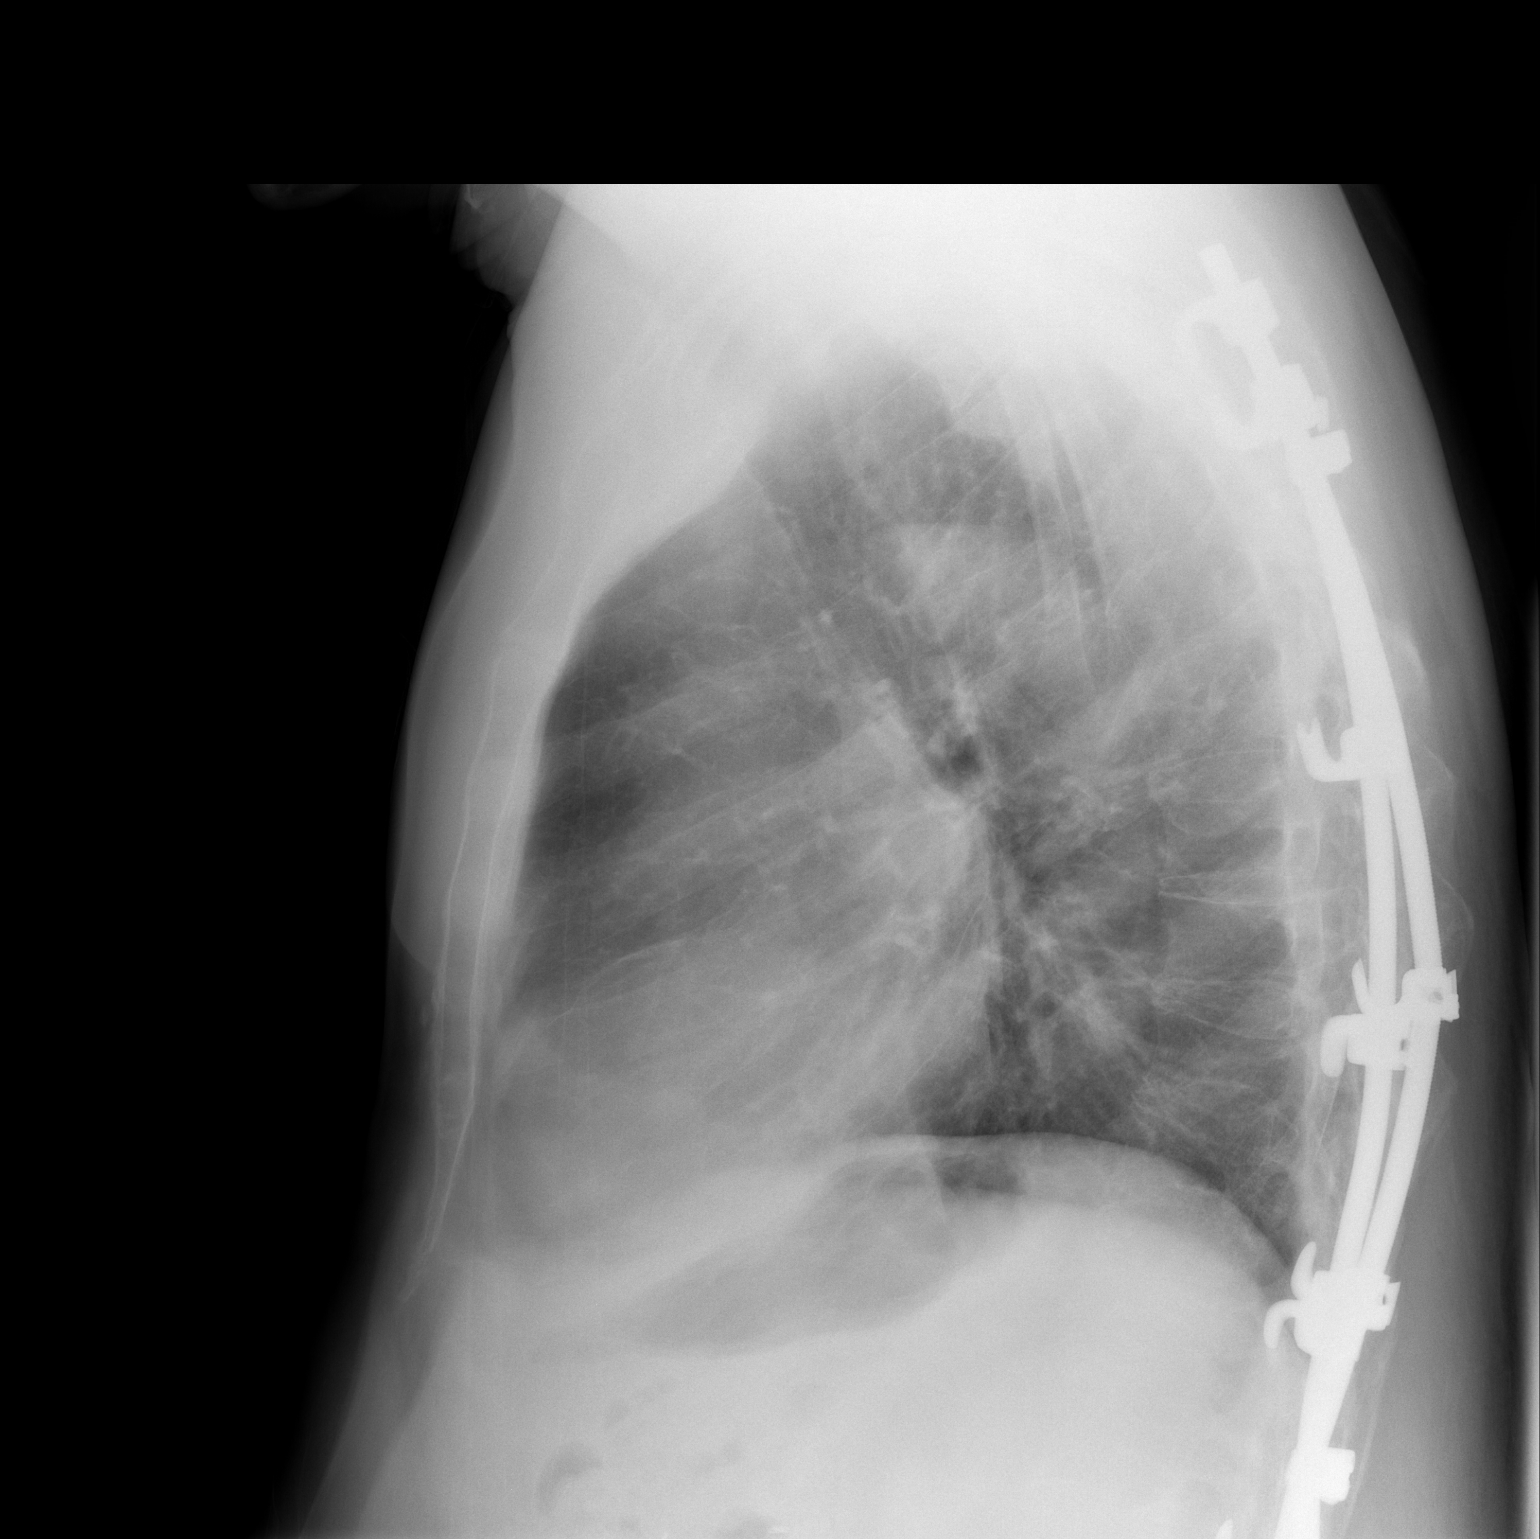

[2 of 2 positions shown; findings below may reference images not displayed]

FINDINGS: The heart size and mediastinal contours are within normal limits. No
pneumothorax or pleural effusion is noted. No acute pulmonary
disease is noted. Scoliosis of thoracic spine is noted with
Harrington rods.
IMPRESSION: No active cardiopulmonary disease.
